# Patient Record
Sex: Male | Born: 1950
Health system: Southern US, Community
[De-identification: ages and names within clinical notes are randomized; demographics above are authoritative.]

## PROBLEM LIST (undated history)

## (undated) DIAGNOSIS — I2510 Atherosclerotic heart disease of native coronary artery without angina pectoris: Secondary | ICD-10-CM

## (undated) DIAGNOSIS — U071 COVID-19: Secondary | ICD-10-CM

## (undated) DIAGNOSIS — I10 Essential (primary) hypertension: Secondary | ICD-10-CM

## (undated) DIAGNOSIS — I214 Non-ST elevation (NSTEMI) myocardial infarction: Secondary | ICD-10-CM

## (undated) DIAGNOSIS — E785 Hyperlipidemia, unspecified: Secondary | ICD-10-CM

## (undated) DIAGNOSIS — E1165 Type 2 diabetes mellitus with hyperglycemia: Secondary | ICD-10-CM

## (undated) HISTORY — DX: Type 2 diabetes mellitus with hyperglycemia: E11.65

## (undated) HISTORY — PX: FRACTURE SURGERY: SHX138

## (undated) HISTORY — DX: COVID-19: U07.1

---
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
## 1998-09-23 ENCOUNTER — Emergency Department (HOSPITAL_COMMUNITY): Admission: EM | Admit: 1998-09-23 | Discharge: 1998-09-23 | Payer: Self-pay | Admitting: Emergency Medicine

## 1998-09-24 ENCOUNTER — Encounter: Payer: Self-pay | Admitting: Emergency Medicine

## 2000-06-01 ENCOUNTER — Encounter: Payer: Self-pay | Admitting: Specialist

## 2000-06-01 ENCOUNTER — Ambulatory Visit (HOSPITAL_COMMUNITY): Admission: RE | Admit: 2000-06-01 | Discharge: 2000-06-01 | Payer: Self-pay | Admitting: Specialist

## 2000-06-03 ENCOUNTER — Ambulatory Visit (HOSPITAL_COMMUNITY): Admission: EM | Admit: 2000-06-03 | Discharge: 2000-06-03 | Payer: Self-pay | Admitting: Emergency Medicine

## 2000-06-03 ENCOUNTER — Encounter: Payer: Self-pay | Admitting: Specialist

## 2012-05-15 ENCOUNTER — Other Ambulatory Visit: Payer: Self-pay | Admitting: Orthopedic Surgery

## 2012-05-15 ENCOUNTER — Encounter (HOSPITAL_BASED_OUTPATIENT_CLINIC_OR_DEPARTMENT_OTHER): Payer: Self-pay | Admitting: *Deleted

## 2012-05-15 NOTE — Progress Notes (Signed)
No heart or resp problems-[facility]

## 2012-05-16 ENCOUNTER — Ambulatory Visit (HOSPITAL_BASED_OUTPATIENT_CLINIC_OR_DEPARTMENT_OTHER)
Admission: RE | Admit: 2012-05-16 | Discharge: 2012-05-16 | Disposition: A | Discharge status: Home / Self Care | Payer: BC Managed Care – PPO | Source: Ambulatory Visit | Attending: Orthopedic Surgery | Admitting: Orthopedic Surgery

## 2012-05-16 ENCOUNTER — Encounter (HOSPITAL_BASED_OUTPATIENT_CLINIC_OR_DEPARTMENT_OTHER): Payer: Self-pay | Admitting: Anesthesiology

## 2012-05-16 ENCOUNTER — Encounter (HOSPITAL_BASED_OUTPATIENT_CLINIC_OR_DEPARTMENT_OTHER)
Admission: RE | Disposition: A | Discharge status: Home / Self Care | Payer: Self-pay | Source: Ambulatory Visit | Attending: Orthopedic Surgery

## 2012-05-16 ENCOUNTER — Ambulatory Visit (HOSPITAL_BASED_OUTPATIENT_CLINIC_OR_DEPARTMENT_OTHER): Payer: BC Managed Care – PPO | Admitting: Anesthesiology

## 2012-05-16 DIAGNOSIS — W268XXA Contact with other sharp object(s), not elsewhere classified, initial encounter: Secondary | ICD-10-CM | POA: Insufficient documentation

## 2012-05-16 DIAGNOSIS — S60459A Superficial foreign body of unspecified finger, initial encounter: Secondary | ICD-10-CM | POA: Insufficient documentation

## 2012-05-16 HISTORY — PX: FOREIGN BODY REMOVAL: SHX962

## 2012-05-16 LAB — POCT HEMOGLOBIN-HEMACUE: Hemoglobin: 13.9 g/dL (ref 13.0–17.0)

## 2012-05-16 SURGERY — FOREIGN BODY REMOVAL ADULT
Anesthesia: General | Site: Thumb | Laterality: Left | Wound class: Clean Contaminated

## 2012-05-16 MED ORDER — LIDOCAINE HCL (CARDIAC) 20 MG/ML IV SOLN
INTRAVENOUS | Status: DC | PRN
  Administered 2012-05-16: 100 mg via INTRAVENOUS

## 2012-05-16 MED ORDER — FENTANYL CITRATE 0.05 MG/ML IJ SOLN
INTRAMUSCULAR | Status: DC | PRN
  Administered 2012-05-16 (×2): 100 ug via INTRAVENOUS

## 2012-05-16 MED ORDER — HYDROCODONE-ACETAMINOPHEN 5-325 MG PO TABS: ORAL_TABLET | ORAL | Status: DC

## 2012-05-16 MED ORDER — CEFAZOLIN SODIUM-DEXTROSE 2-3 GM-% IV SOLR
2.0000 g | INTRAVENOUS | Status: AC
  Administered 2012-05-16: 2 g via INTRAVENOUS

## 2012-05-16 MED ORDER — CHLORHEXIDINE GLUCONATE 4 % EX LIQD: 60.0000 mL | Freq: Once | CUTANEOUS | Status: DC

## 2012-05-16 MED ORDER — BUPIVACAINE HCL (PF) 0.25 % IJ SOLN
INTRAMUSCULAR | Status: DC | PRN
  Administered 2012-05-16: 5 mL

## 2012-05-16 MED ORDER — PROPOFOL 10 MG/ML IV BOLUS
INTRAVENOUS | Status: DC | PRN
  Administered 2012-05-16: 250 mg via INTRAVENOUS

## 2012-05-16 MED ORDER — ONDANSETRON HCL 4 MG/2ML IJ SOLN
INTRAMUSCULAR | Status: DC | PRN
  Administered 2012-05-16: 4 mg via INTRAVENOUS

## 2012-05-16 MED ORDER — DEXAMETHASONE SODIUM PHOSPHATE 4 MG/ML IJ SOLN
INTRAMUSCULAR | Status: DC | PRN
  Administered 2012-05-16: 10 mg via INTRAVENOUS

## 2012-05-16 MED ORDER — MIDAZOLAM HCL 5 MG/5ML IJ SOLN
INTRAMUSCULAR | Status: DC | PRN
  Administered 2012-05-16: 2 mg via INTRAVENOUS

## 2012-05-16 MED ORDER — LACTATED RINGERS IV SOLN
INTRAVENOUS | Status: DC
  Administered 2012-05-16 (×2): via INTRAVENOUS

## 2012-05-16 SURGICAL SUPPLY — 43 items
BANDAGE COBAN STERILE 2 (GAUZE/BANDAGES/DRESSINGS) ×2 IMPLANT
BANDAGE ELASTIC 3 VELCRO ST LF (GAUZE/BANDAGES/DRESSINGS) IMPLANT
BANDAGE GAUZE ELAST BULKY 4 IN (GAUZE/BANDAGES/DRESSINGS) IMPLANT
BLADE MINI RND TIP GREEN BEAV (BLADE) IMPLANT
BLADE SURG 15 STRL LF DISP TIS (BLADE) ×2 IMPLANT
BLADE SURG 15 STRL SS (BLADE) ×2
BNDG COHESIVE 1X5 TAN STRL LF (GAUZE/BANDAGES/DRESSINGS) IMPLANT
BNDG ELASTIC 2 VLCR STRL LF (GAUZE/BANDAGES/DRESSINGS) IMPLANT
BNDG ESMARK 4X9 LF (GAUZE/BANDAGES/DRESSINGS) ×2 IMPLANT
CHLORAPREP W/TINT 26ML (MISCELLANEOUS) ×2 IMPLANT
CLOTH BEACON ORANGE TIMEOUT ST (SAFETY) ×2 IMPLANT
CORDS BIPOLAR (ELECTRODE) ×2 IMPLANT
COVER MAYO STAND STRL (DRAPES) ×2 IMPLANT
COVER TABLE BACK 60X90 (DRAPES) ×2 IMPLANT
CUFF TOURNIQUET SINGLE 18IN (TOURNIQUET CUFF) ×2 IMPLANT
DRAPE EXTREMITY T 121X128X90 (DRAPE) ×2 IMPLANT
DRAPE SURG 17X23 STRL (DRAPES) ×2 IMPLANT
GAUZE XEROFORM 1X8 LF (GAUZE/BANDAGES/DRESSINGS) ×2 IMPLANT
GLOVE BIO SURGEON STRL SZ 6.5 (GLOVE) ×2 IMPLANT
GLOVE BIO SURGEON STRL SZ7.5 (GLOVE) ×2 IMPLANT
GLOVE BIOGEL PI IND STRL 7.0 (GLOVE) ×2 IMPLANT
GLOVE BIOGEL PI INDICATOR 7.0 (GLOVE) ×2
GOWN BRE IMP PREV XXLGXLNG (GOWN DISPOSABLE) ×2 IMPLANT
GOWN PREVENTION PLUS XLARGE (GOWN DISPOSABLE) ×2 IMPLANT
NDL SAFETY ECLIPSE 18X1.5 (NEEDLE) IMPLANT
NEEDLE HYPO 18GX1.5 SHARP (NEEDLE)
NEEDLE HYPO 25X1 1.5 SAFETY (NEEDLE) ×2 IMPLANT
NS IRRIG 1000ML POUR BTL (IV SOLUTION) ×2 IMPLANT
PACK BASIN DAY SURGERY FS (CUSTOM PROCEDURE TRAY) ×2 IMPLANT
PAD CAST 3X4 CTTN HI CHSV (CAST SUPPLIES) IMPLANT
PADDING CAST ABS 4INX4YD NS (CAST SUPPLIES) ×1
PADDING CAST ABS COTTON 4X4 ST (CAST SUPPLIES) ×1 IMPLANT
PADDING CAST COTTON 3X4 STRL (CAST SUPPLIES)
SPONGE GAUZE 4X4 12PLY (GAUZE/BANDAGES/DRESSINGS) ×2 IMPLANT
STOCKINETTE 4X48 STRL (DRAPES) ×2 IMPLANT
SUT ETHILON 3 0 PS 1 (SUTURE) IMPLANT
SUT ETHILON 4 0 PS 2 18 (SUTURE) ×2 IMPLANT
SWAB COLLECTION DEVICE MRSA (MISCELLANEOUS) IMPLANT
SYR BULB 3OZ (MISCELLANEOUS) ×2 IMPLANT
SYR CONTROL 10ML LL (SYRINGE) ×2 IMPLANT
TOWEL OR 17X24 6PK STRL BLUE (TOWEL DISPOSABLE) ×2 IMPLANT
TUBE ANAEROBIC SPECIMEN COL (MISCELLANEOUS) IMPLANT
UNDERPAD 30X30 INCONTINENT (UNDERPADS AND DIAPERS) ×2 IMPLANT

## 2012-05-16 NOTE — Transfer of Care (Signed)
Immediate Anesthesia Transfer of Care Note  Patient: Aaron Summers  Procedure(s) Performed: Procedure(s): FOREIGN BODY REMOVAL LEFT THUMB (Left)  Patient Location: PACU  Anesthesia Type:General  Level of Consciousness: awake and alert   Airway & Oxygen Therapy: Patient Spontanous Breathing and Patient connected to face mask oxygen  Post-op Assessment: Report given to PACU RN and Post -op Vital signs reviewed and stable  Post vital signs: Reviewed and stable  Complications: No apparent anesthesia complications

## 2012-05-16 NOTE — Brief Op Note (Signed)
05/16/2012  10:33 AM  PATIENT:  Theodis Blaze  62 y.o. male  PRE-OPERATIVE DIAGNOSIS:  Foreign Body Left Thumb  POST-OPERATIVE DIAGNOSIS:  foreign body left thumb  PROCEDURE:  Procedure(s): FOREIGN BODY REMOVAL LEFT THUMB (Left)  SURGEON:  Surgeon(s) and Role:    * Tami Ribas, MD - Primary  PHYSICIAN ASSISTANT:   ASSISTANTS: none   ANESTHESIA:   general  EBL:  Total I/O In: 1000 [I.V.:1000] Out: -   BLOOD ADMINISTERED:none  DRAINS: none   LOCAL MEDICATIONS USED:  MARCAINE     SPECIMEN:  Source of Specimen:  left thumb  DISPOSITION OF SPECIMEN:  Patient  COUNTS:  YES  TOURNIQUET:   Total Tourniquet Time Documented: Upper Arm (Left) - 11 minutes Total: Upper Arm (Left) - 11 minutes   DICTATION: .Other Dictation: Dictation Number [id number]  PLAN OF CARE: Discharge to home after PACU  PATIENT DISPOSITION:  PACU - hemodynamically stable.

## 2012-05-16 NOTE — Anesthesia Procedure Notes (Signed)
Procedure Name: LMA Insertion Date/Time: 05/16/2012 10:10 AM Performed by: Caren Macadam Pre-anesthesia Checklist: Patient identified, Emergency Drugs available, Suction available and Patient being monitored Patient Re-evaluated:Patient Re-evaluated prior to inductionOxygen Delivery Method: Circle System Utilized Preoxygenation: Pre-oxygenation with 100% oxygen Intubation Type: IV induction Ventilation: Mask ventilation without difficulty LMA: LMA inserted LMA Size: 5.0 Number of attempts: 1 Airway Equipment and Method: bite block Placement Confirmation: positive ETCO2 and breath sounds checked- equal and bilateral Tube secured with: Tape Dental Injury: Teeth and Oropharynx as per pre-operative assessment

## 2012-05-16 NOTE — Anesthesia Preprocedure Evaluation (Signed)
Anesthesia Evaluation  Patient identified by MRN, date of birth, ID band Patient awake    Reviewed: Allergy & Precautions, H&P , NPO status , Patient's Chart, lab work & pertinent test results  Airway Mallampati: II  Neck ROM: full    Dental   Pulmonary          Cardiovascular     Neuro/Psych    GI/Hepatic   Endo/Other    Renal/GU      Musculoskeletal   Abdominal   Peds  Hematology   Anesthesia Other Findings   Reproductive/Obstetrics                           Anesthesia Physical Anesthesia Plan  ASA: I  Anesthesia Plan: General   Post-op Pain Management:    Induction: Intravenous  Airway Management Planned: LMA  Additional Equipment:   Intra-op Plan:   Post-operative Plan:   Informed Consent: I have reviewed the patients History and Physical, chart, labs and discussed the procedure including the risks, benefits and alternatives for the proposed anesthesia with the patient or authorized representative who has indicated his/her understanding and acceptance.     Plan Discussed with: CRNA, Anesthesiologist and Surgeon  Anesthesia Plan Comments:         Anesthesia Quick Evaluation

## 2012-05-16 NOTE — Op Note (Signed)
[id number] 

## 2012-05-16 NOTE — H&P (Signed)
  Aaron Summers is an 62 y.o. male.   Chief Complaint: foreign body left thumb HPI: 62 yo rhd male states that 3 days ago got splinter in left thumb while moving lumber in garage.  Was able to remove some himself, but feels there is a piece remaining in left thumb.  No fevers, chills, night sweats.  Past Medical History  Diagnosis Date  . Medical history non-contributory     Past Surgical History  Procedure Laterality Date  . Hernia repair      lt ing as infant  . Fracture surgery  1970s    fx rt clavical    History reviewed. No pertinent family history. Social History:  reports that he has never smoked. He does not have any smokeless tobacco history on file. He reports that he does not drink alcohol or use illicit drugs.  Allergies: No Known Allergies  Medications Prior to Admission  Medication Sig Dispense Refill  . sulfamethoxazole-trimethoprim (BACTRIM DS) 800-160 MG per tablet Take 1 tablet by mouth 2 (two) times daily.        No results found for this or any previous visit (from the past 48 hour(s)).  No results found.   A comprehensive review of systems was negative.  Blood pressure 193/111, pulse 72, temperature 97.7 F (36.5 C), temperature source Oral, resp. rate 20, height 6\' 4"  (1.93 m), weight 112.946 kg (249 lb), SpO2 96.00%.  General appearance: alert, cooperative and appears stated age Head: Normocephalic, without obvious abnormality, atraumatic Neck: supple, symmetrical, trachea midline Resp: clear to auscultation bilaterally Cardio: regular rate and rhythm GI: non tender Extremities: intact sensation and capillary refill all digits.  +epl/fpl/io.  ttp volar thumb over wound.  no other ttp.  5 mm wound with surrounding irritation.  no streaks or drainage. Pulses: 2+ and symmetric Skin: as above Neurologic: Grossly normal Incision/Wound: As above  Assessment/Plan Left thumb retained foreign body.  Non operative and operative treatment options  were discussed with the patient and patient wishes to proceed with operative treatment. Risks, benefits, and alternatives of surgery were discussed and the patient agrees with the plan of care.   Titilayo Hagans R 05/16/2012, 8:32 AM

## 2012-05-16 NOTE — Anesthesia Postprocedure Evaluation (Signed)
Anesthesia Post Note  Patient: Aaron Summers  Procedure(s) Performed: Procedure(s) (LRB): FOREIGN BODY REMOVAL LEFT THUMB (Left)  Anesthesia type: General  Patient location: PACU  Post pain: Pain level controlled and Adequate analgesia  Post assessment: Post-op Vital signs reviewed, Patient's Cardiovascular Status Stable, Respiratory Function Stable, Patent Airway and Pain level controlled  Last Vitals:  Filed Vitals:   05/16/12 1115  BP: 162/88  Pulse: 65  Temp:   Resp: 17    Post vital signs: Reviewed and stable  Level of consciousness: awake, alert  and oriented  Complications: No apparent anesthesia complications

## 2012-05-17 ENCOUNTER — Encounter (HOSPITAL_BASED_OUTPATIENT_CLINIC_OR_DEPARTMENT_OTHER): Payer: Self-pay | Admitting: Orthopedic Surgery

## 2012-05-17 NOTE — Op Note (Signed)
NAME:  Aaron Summers, Aaron Summers NO.:  [id number]  MEDICAL RECORD NO.:  [id number]  LOCATION:                                 FACILITY:  PHYSICIAN:  Betha Loa, MD             DATE OF BIRTH:  DATE OF PROCEDURE:  05/16/2012 DATE OF DISCHARGE:                              OPERATIVE REPORT   PREOPERATIVE DIAGNOSIS:  Left thumb foreign body.  POSTOPERATIVE DIAGNOSIS:  Left thumb foreign body.  PROCEDURE:  Excision of foreign body, left thumb.  SURGEON:  Betha Loa, MD  ASSISTANT:  None.  ANESTHESIA:  General.  IV FLUIDS:  Per anesthesia flow sheet.  ESTIMATED BLOOD LOSS:  Minimal.  COMPLICATIONS:  None.  SPECIMENS:  Wood splinter from left thumb given to the patient.  TOURNIQUET:  11 minutes.  DISPOSITION:  Stable to PACU.  INDICATIONS:  Aaron Summers is a 62 year old male who states he was moving lumber in his garage 3 days ago when he had got a splinter in his left thumb.  He got most of it out, but feels there is still some remaining. He saw me in the office.  We discussed the nature of the condition.  He wished to have it removed.  Risks, benefits and alternatives of the surgery were discussed including the risk of blood loss; infection; damage to nerves, vessels, tendons, ligaments, bone; failure of surgery; need for additional surgery; complications with wound healing; continued pain; retained foreign body.  He voiced understanding of these risks and elected to proceed.  OPERATIVE COURSE:  After being identified preoperatively by myself, the patient and I agreed upon the procedure and site of procedure.  Surgical site was marked.  The risks, benefits, and alternatives of surgery were reviewed and he wished to proceed.  Surgical consent had been signed. He was given IV Ancef as preoperative antibiotic prophylaxis.  He was transported to the operating room and placed on the operating room table in supine position with left upper extremity on an armboard.   General anesthesia was induced by anesthesiologist.  Left upper extremity was prepped and draped in normal sterile orthopedic fashion.  Surgical pause was performed between the surgeons, anesthesia, and operating room staff, and all were in agreement as to the patient, procedure, and site of procedure.  Tourniquet at the proximal aspect of the extremity was inflated to 250 mmHg after exsanguination of the limb with an Esmarch bandage.  The wound was extended both radially and ulnarly at the volar aspect of the thumb.  The wood splinter was easily identified in the subcutaneous tissues.  It was removed in its entirety.  The radial digital nerve was identified and was intact.  The FPL tendon was visualized and was intact.  The wound was explored and no remaining foreign body was noted.  The wound was copiously irrigated with sterile saline.  It was closed with 4-0 nylon in a horizontal mattress fashion. The traumatic portion of the wound was left open for any drainage as necessary.  The wound was injected with 5 mL of 0.25% plain Marcaine. It was then dressed with sterile Xeroform, 4x4s, and wrapped with a Coban dressing lightly.  Tourniquet was deflated at 11 minutes. Fingertips were pink with brisk capillary refill after deflation of the tourniquet.  Operative drapes were broken down.  The patient was awoken from anesthesia safely.  He was transferred back to the stretcher and taken to PACU in stable condition.  I will see him back in the office in 1 week for postprocedure followup.  I will give him Norco 5/325, 1-2 p.o. q.6 hours p.r.n. pain, dispensed, #30.     Betha Loa, MD     KK/MEDQ  D:  05/16/2012  T:  05/17/2012  Job:  [id number]

## 2016-12-01 ENCOUNTER — Other Ambulatory Visit: Payer: Self-pay | Admitting: Internal Medicine

## 2016-12-01 DIAGNOSIS — R9431 Abnormal electrocardiogram [ECG] [EKG]: Secondary | ICD-10-CM

## 2016-12-06 ENCOUNTER — Other Ambulatory Visit: Payer: Self-pay

## 2016-12-06 ENCOUNTER — Ambulatory Visit (HOSPITAL_COMMUNITY): Payer: BLUE CROSS/BLUE SHIELD | Attending: Cardiology

## 2016-12-06 ENCOUNTER — Encounter (INDEPENDENT_AMBULATORY_CARE_PROVIDER_SITE_OTHER): Payer: Self-pay

## 2016-12-06 DIAGNOSIS — R9431 Abnormal electrocardiogram [ECG] [EKG]: Secondary | ICD-10-CM | POA: Diagnosis not present

## 2016-12-06 DIAGNOSIS — I517 Cardiomegaly: Secondary | ICD-10-CM | POA: Diagnosis not present

## 2017-12-10 ENCOUNTER — Other Ambulatory Visit: Payer: Self-pay

## 2017-12-10 ENCOUNTER — Emergency Department (HOSPITAL_COMMUNITY): Payer: BLUE CROSS/BLUE SHIELD

## 2017-12-10 ENCOUNTER — Encounter (HOSPITAL_COMMUNITY): Payer: Self-pay

## 2017-12-10 ENCOUNTER — Inpatient Hospital Stay (HOSPITAL_COMMUNITY)
Admission: EM | Admit: 2017-12-10 | Discharge: 2017-12-13 | DRG: 282 | Disposition: A | Discharge status: Home / Self Care | Payer: BLUE CROSS/BLUE SHIELD | Attending: Internal Medicine | Admitting: Internal Medicine

## 2017-12-10 DIAGNOSIS — I25118 Atherosclerotic heart disease of native coronary artery with other forms of angina pectoris: Secondary | ICD-10-CM | POA: Diagnosis not present

## 2017-12-10 DIAGNOSIS — Z7982 Long term (current) use of aspirin: Secondary | ICD-10-CM | POA: Diagnosis not present

## 2017-12-10 DIAGNOSIS — Z6832 Body mass index (BMI) 32.0-32.9, adult: Secondary | ICD-10-CM | POA: Diagnosis not present

## 2017-12-10 DIAGNOSIS — G4733 Obstructive sleep apnea (adult) (pediatric): Secondary | ICD-10-CM | POA: Diagnosis present

## 2017-12-10 DIAGNOSIS — I209 Angina pectoris, unspecified: Secondary | ICD-10-CM

## 2017-12-10 DIAGNOSIS — I10 Essential (primary) hypertension: Secondary | ICD-10-CM | POA: Diagnosis present

## 2017-12-10 DIAGNOSIS — E785 Hyperlipidemia, unspecified: Secondary | ICD-10-CM | POA: Diagnosis not present

## 2017-12-10 DIAGNOSIS — I214 Non-ST elevation (NSTEMI) myocardial infarction: Secondary | ICD-10-CM

## 2017-12-10 DIAGNOSIS — Z79899 Other long term (current) drug therapy: Secondary | ICD-10-CM | POA: Diagnosis not present

## 2017-12-10 DIAGNOSIS — I2510 Atherosclerotic heart disease of native coronary artery without angina pectoris: Secondary | ICD-10-CM | POA: Diagnosis present

## 2017-12-10 DIAGNOSIS — E782 Mixed hyperlipidemia: Secondary | ICD-10-CM

## 2017-12-10 DIAGNOSIS — E669 Obesity, unspecified: Secondary | ICD-10-CM | POA: Diagnosis present

## 2017-12-10 HISTORY — DX: Angina pectoris, unspecified: I20.9

## 2017-12-10 HISTORY — DX: Essential (primary) hypertension: I10

## 2017-12-10 HISTORY — DX: Hyperlipidemia, unspecified: E78.5

## 2017-12-10 HISTORY — DX: Atherosclerotic heart disease of native coronary artery without angina pectoris: I25.10

## 2017-12-10 HISTORY — DX: Non-ST elevation (NSTEMI) myocardial infarction: I21.4

## 2017-12-10 HISTORY — DX: Mixed hyperlipidemia: E78.2

## 2017-12-10 LAB — CBC
HCT: 48.5 % (ref 39.0–52.0)
Hemoglobin: 16.3 g/dL (ref 13.0–17.0)
MCH: 29.5 pg (ref 26.0–34.0)
MCHC: 33.6 g/dL (ref 30.0–36.0)
MCV: 87.7 fL (ref 80.0–100.0)
Platelets: 239 10*3/uL (ref 150–400)
RBC: 5.53 MIL/uL (ref 4.22–5.81)
RDW: 12.4 % (ref 11.5–15.5)
WBC: 9.2 10*3/uL (ref 4.0–10.5)
nRBC: 0 % (ref 0.0–0.2)

## 2017-12-10 LAB — BASIC METABOLIC PANEL
Anion gap: 10 (ref 5–15)
BUN: 13 mg/dL (ref 8–23)
CO2: 23 mmol/L (ref 22–32)
Calcium: 9.5 mg/dL (ref 8.9–10.3)
Chloride: 104 mmol/L (ref 98–111)
Creatinine, Ser: 0.8 mg/dL (ref 0.61–1.24)
Glucose, Bld: 160 mg/dL — ABNORMAL HIGH (ref 70–99)
Potassium: 3.8 mmol/L (ref 3.5–5.1)
Sodium: 137 mmol/L (ref 135–145)

## 2017-12-10 LAB — I-STAT TROPONIN, ED: Troponin i, poc: 2.4 ng/mL (ref 0.00–0.08)

## 2017-12-10 MED ORDER — ONDANSETRON HCL 4 MG/2ML IJ SOLN
4.0000 mg | Freq: Once | INTRAMUSCULAR | Status: AC
  Administered 2017-12-10: 4 mg via INTRAVENOUS
  Filled 2017-12-10: qty 2

## 2017-12-10 MED ORDER — NITROGLYCERIN 0.4 MG SL SUBL
0.4000 mg | SUBLINGUAL_TABLET | SUBLINGUAL | Status: DC | PRN
  Administered 2017-12-11 (×2): 0.4 mg via SUBLINGUAL
  Filled 2017-12-10: qty 1

## 2017-12-10 MED ORDER — SODIUM CHLORIDE 0.9 % IV SOLN
INTRAVENOUS | Status: DC
  Administered 2017-12-10: 21:00:00 via INTRAVENOUS

## 2017-12-10 MED ORDER — IOPAMIDOL (ISOVUE-370) INJECTION 76%
INTRAVENOUS | Status: AC
  Filled 2017-12-10: qty 100

## 2017-12-10 MED ORDER — METOPROLOL TARTRATE 25 MG PO TABS
25.0000 mg | ORAL_TABLET | Freq: Two times a day (BID) | ORAL | Status: DC
  Administered 2017-12-10 – 2017-12-12 (×5): 25 mg via ORAL
  Filled 2017-12-10 (×5): qty 1

## 2017-12-10 MED ORDER — ONDANSETRON HCL 4 MG/2ML IJ SOLN: 4.0000 mg | Freq: Four times a day (QID) | INTRAMUSCULAR | Status: DC | PRN

## 2017-12-10 MED ORDER — NITROGLYCERIN 0.4 MG SL SUBL
0.4000 mg | SUBLINGUAL_TABLET | SUBLINGUAL | Status: DC | PRN
  Administered 2017-12-10 (×3): 0.4 mg via SUBLINGUAL
  Filled 2017-12-10: qty 1

## 2017-12-10 MED ORDER — MORPHINE SULFATE (PF) 2 MG/ML IV SOLN
2.0000 mg | Freq: Once | INTRAVENOUS | Status: AC
  Administered 2017-12-10: 2 mg via INTRAVENOUS
  Filled 2017-12-10: qty 1

## 2017-12-10 MED ORDER — IRBESARTAN 300 MG PO TABS
300.0000 mg | ORAL_TABLET | Freq: Every day | ORAL | Status: DC
  Administered 2017-12-11 – 2017-12-13 (×3): 300 mg via ORAL
  Filled 2017-12-10 (×5): qty 1

## 2017-12-10 MED ORDER — ACETAMINOPHEN 325 MG PO TABS: 650.0000 mg | ORAL_TABLET | ORAL | Status: DC | PRN

## 2017-12-10 MED ORDER — IOPAMIDOL (ISOVUE-370) INJECTION 76%
100.0000 mL | Freq: Once | INTRAVENOUS | Status: AC | PRN
  Administered 2017-12-10: 100 mL via INTRAVENOUS

## 2017-12-10 MED ORDER — HEPARIN (PORCINE) 25000 UT/250ML-% IV SOLN
1300.0000 [IU]/h | INTRAVENOUS | Status: DC
  Administered 2017-12-10: 1300 [IU]/h via INTRAVENOUS
  Filled 2017-12-10: qty 250

## 2017-12-10 MED ORDER — ATORVASTATIN CALCIUM 80 MG PO TABS
80.0000 mg | ORAL_TABLET | Freq: Every day | ORAL | Status: DC
  Administered 2017-12-10 – 2017-12-12 (×3): 80 mg via ORAL
  Filled 2017-12-10 (×3): qty 1

## 2017-12-10 MED ORDER — ASPIRIN 81 MG PO CHEW
162.0000 mg | CHEWABLE_TABLET | Freq: Once | ORAL | Status: AC
  Administered 2017-12-10: 162 mg via ORAL
  Filled 2017-12-10: qty 2

## 2017-12-10 MED ORDER — ASPIRIN EC 81 MG PO TBEC
81.0000 mg | DELAYED_RELEASE_TABLET | Freq: Every day | ORAL | Status: DC
  Administered 2017-12-11 – 2017-12-13 (×3): 81 mg via ORAL
  Filled 2017-12-10 (×3): qty 1

## 2017-12-10 MED ORDER — HEPARIN SODIUM (PORCINE) 5000 UNIT/ML IJ SOLN
4000.0000 [IU] | Freq: Once | INTRAMUSCULAR | Status: AC
  Administered 2017-12-10: 4000 [IU] via INTRAVENOUS

## 2017-12-10 NOTE — ED Notes (Signed)
Cards at bedside

## 2017-12-10 NOTE — H&P (Addendum)
Cardiology History & Physical    Patient ID: Aaron Summers MRN: [id number], DOB: 1950/01/18 Date of Encounter: 12/10/2017, 11:09 PM Primary Physician: Kirby Funk, MD Primary Cardiologist: No primary care provider on file. Primary Electrophysiologist:  None  Chief Complaint: Chest pain Reason for Admission: NSTEMI Requesting MD: Adella Nissen  HPI: Aaron Summers is a 67 y.o. male with history of hypertension, hyperlipidemia, who presents with NSTEMI. The patient recently visited [place] last month and dead heavy hiking for multiple weeks without limitations. He began having dull chest discomfort (low grade) on Saturday evening. Yesterday he developed intense discomfort in the front of his chest and bilateral arms from 4:15 pm to 11:15 pm. The pain eventually eased off and he went to sleep. He awoke this morning without ongoing chest discomfort; went to work, did a full day of work and then at about 4:15 pm today developed recurrent symptoms. Accompanying symptoms have included nausea. No syncope or pre-syncope.  He had a stress test many years ago that was normal (pharmacologic and exercise both have been done previously). Chest pain is currently resolved.   Past Medical History:  Diagnosis Date  . Coronary artery disease   . Hypertension   . Medical history non-contributory      Surgical History:  Past Surgical History:  Procedure Laterality Date  . FOREIGN BODY REMOVAL Left 05/16/2012   Procedure: FOREIGN BODY REMOVAL LEFT THUMB;  Surgeon: Tami Ribas, MD;  Location: [facility];  Service: Orthopedics;  Laterality: Left;  . FRACTURE SURGERY  1970s   fx rt clavical  . HERNIA REPAIR     lt ing as infant     Home Meds: Prior to Admission medications   Medication Sig Start Date End Date Taking? Authorizing Provider  amLODipine-valsartan (EXFORGE) 10-320 MG tablet Take 1 tablet by mouth daily.   Yes [provider]  aspirin EC 81 MG tablet Take 81 mg  by mouth daily.   Yes [provider]  pravastatin (PRAVACHOL) 40 MG tablet Take 40 mg by mouth daily.   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 He does not smoke cigarettes and has never smoked.  Family history is positive for heart disease mother had abdominal aortic aneurysm, father had heart surgery in his late 28s but was a smoker.   Review of Systems: General: negative for chills, fever, night sweats or weight changes.  Cardiovascular: positive for chest pain arm pain nausea Dermatological: negative for rash Respiratory: negative for cough or wheezing, positive for dyspnea  Urologic: negative for hematuria Abdominal: negative for nausea, vomiting, diarrhea, bright red blood per rectum, melena, or hematemesis Neurologic: negative for visual changes, syncope, or dizziness All other systems reviewed and are otherwise negative except as noted above.  Labs:   Lab Results  Component Value Date   WBC 9.2 12/10/2017   HGB 16.3 12/10/2017   HCT 48.5 12/10/2017   MCV 87.7 12/10/2017   PLT 239 12/10/2017    Recent Labs  Lab 12/10/17 1802  NA 137  K 3.8  CL 104  CO2 23  BUN 13  CREATININE 0.80  CALCIUM 9.5  GLUCOSE 160*   No results for input(s): CKTOTAL, CKMB, TROPONINI in the last 72 hours. No results found for: CHOL, HDL,  LDLCALC, TRIG No results found for: DDIMER  Radiology/Studies:  Dg Chest 2 View  Result Date: 12/10/2017 CLINICAL DATA:  Chest pain. Coronary artery disease and hypertension. EXAM: CHEST - 2 VIEW COMPARISON:  None. FINDINGS: Mild hyperinflation. Patient rotated left on the frontal. Midline trachea. Normal heart size. Tortuous thoracic aorta. Atherosclerosis in the transverse aorta. No pleural effusion or  pneumothorax. Clear lungs. IMPRESSION: No acute cardiopulmonary disease. Aortic Atherosclerosis (ICD10-I70.0). Electronically Signed   By: Jeronimo GreavesKyle  Talbot M.D.   On: 12/10/2017 18:53   Ct Angio Chest Pe W/cm &/or Wo Cm  Result Date: 12/10/2017 CLINICAL DATA:  67 year old male with dull chest pain x2 days. Recent return from international travel ([place] EXAM: CT ANGIOGRAPHY CHEST WITH CONTRAST TECHNIQUE: Multidetector CT imaging of the chest was performed using the standard protocol during bolus administration of intravenous contrast. Multiplanar CT image reconstructions and MIPs were obtained to evaluate the vascular anatomy. CONTRAST:  63 milliliters ISOVUE-370 IOPAMIDOL (ISOVUE-370) INJECTION 76% COMPARISON:  Chest radiographs earlier today. FINDINGS: Cardiovascular: Good contrast bolus timing in the pulmonary arterial tree. Mild lower lobe respiratory motion. No focal filling defect identified in the pulmonary arteries to suggest acute pulmonary embolism. Calcified coronary artery atherosclerosis (series 7, image 192). Little contrast in the aorta on this exam. Calcified aortic atherosclerosis. No cardiomegaly or pericardial effusion. Mediastinum/Nodes: Negative. No lymphadenopathy. Lungs/Pleura: Major airways are patent aside from some atelectatic changes about the hila. Mild dependent ground-glass opacity in both lungs most resembles atelectasis. No pleural effusion. No other abnormal pulmonary opacity. Upper Abdomen: Simple fluid density round 4.5 cm area in the central liver most suggestive of a benign a patent cysts. Otherwise negative visible liver, spleen, adrenal glands, and bowel in the upper abdomen. Musculoskeletal: Mild postoperative changes to the distal right clavicle. Mild Dextroconvex thoracic scoliosis. No acute osseous abnormality identified. Review of the MIP images confirms the above findings. IMPRESSION: 1. Negative for acute pulmonary embolus. 2. Calcified coronary artery and Aortic  Atherosclerosis (ICD10-I70.0). 3. Mild pulmonary atelectasis. 4. Benign hepatic cyst suspected in the central liver. Electronically Signed   By: Odessa FlemingH  Hall M.D.   On: 12/10/2017 20:54   Wt Readings from Last 3 Encounters:  12/10/17 114.8 kg  05/16/12 112.9 kg    EKG: normal sinus rhythm without ischemic ST/T changes   Physical Exam: Blood pressure (!) 141/85, pulse 81, temperature 97.7 F (36.5 C), temperature source Oral, resp. rate 20, height 6\' 2"  (1.88 m), weight 114.8 kg, SpO2 93 %. Body mass index is 32.48 kg/m. General: Well developed, well nourished, diaphoretic Head: Normocephalic, atraumatic, sclera non-icteric, no xanthomas, nares are without discharge.  Neck: Negative for carotid bruits. JVD not elevated. Lungs: Clear bilaterally to auscultation without wheezes, rales, or rhonchi. Breathing is unlabored. Heart: RRR with S1 S2. No murmurs, rubs, or gallops appreciated. Abdomen: Soft, non-tender, non-distended with normoactive bowel sounds. No hepatomegaly. No rebound/guarding. No obvious abdominal masses. Msk:  Strength and tone appear normal for age. Extremities: No clubbing or cyanosis. No edema.  Distal pedal pulses are 2+ and equal bilaterally. Neuro: Alert and oriented X 3. No focal deficit. No facial asymmetry. Moves all extremities spontaneously. Psych:  Responds to questions appropriately with a normal affect.    Assessment and Plan   Aaron Summers is a 67 y.o. male with history of hypertension, hyperlipidemia, who presents with NSTEMI.   1. Non-ST segment myocardial infarction. Somewhat delayed presentation but typical symptoms, and elevated troponin. Recent travel but PE ruled out with CT angiogram. CAD risk factors  include HTN, HL, and FHx. Currently chest pain free. 2. Essential hypertension 3. Hyperlipidemia  - Aspirin 81 mg po daily - Hold P2Y12 pending LHC - Heparin per pharmacy - Atorvastatin 80 mg po qhs - Metoprolol 25 mg po BID - Irbesartan 300 mg  po daily (valsartan nonformulary) - Hold home HCTZ - NTGL PRN - TTE - HgbA1c, TSH, lipid panel - NPO for LHC - Repeat 12-lead EKG for any change in symptom status    Severity of Illness: The appropriate patient status for this patient is INPATIENT. Inpatient status is judged to be reasonable and necessary in order to provide the required intensity of service to ensure the patient's safety. The patient's presenting symptoms, physical exam findings, and initial radiographic and laboratory data in the context of their chronic comorbidities is felt to place them at high risk for further clinical deterioration. Furthermore, it is not anticipated that the patient will be medically stable for discharge from the hospital within 2 midnights of admission. The following factors support the patient status of inpatient.   " The patient's presenting symptoms include chest pain. " The worrisome physical exam findings include diaphoresis. " The initial radiographic and laboratory data are worrisome because of abnormal troponin. " The chronic co-morbidities include hypertension and hyperlipidemia.   * I certify that at the point of admission it is my clinical judgment that the patient will require inpatient hospital care spanning beyond 2 midnights from the point of admission due to high intensity of service, high risk for further deterioration and high frequency of surveillance required.*    For questions or updates, please contact [facility] Please consult [email or web address] for contact info under Cardiology/STEMI.  Signed, Laverda Page, MD 12/10/2017, 11:09 PM

## 2017-12-10 NOTE — ED Provider Notes (Signed)
[facility] EMERGENCY DEPARTMENT Provider Note   CSN: [id number] Arrival date & time: 12/10/17  1743     History   Chief Complaint Chief Complaint  Patient presents with  . Chest Pain    HPI Aaron Summers is a 67 y.o. male.  Patient just recently flew back from [place].  Patient also was doing a lot of hiking  had no chest pain.  Yesterday patient had sudden onset of chest pain sort of substernal area started at 4 in the afternoon and 10:59 in the evening.  Then slowly dissipated.  Felt okay this morning.  Pain reoccurred at 4:00's afternoon.  Is remained constant currently 6 out of 10.  Pain radiates to the back.  Pain is worse with taking a deep breath.  No history of coronary disease.  Does have a history of hypertension.  No nausea vomiting no diaphoresis.     Past Medical History:  Diagnosis Date  . Coronary artery disease   . Hypertension   . Medical history non-contributory     There are no active problems to display for this patient.   Past Surgical History:  Procedure Laterality Date  . FOREIGN BODY REMOVAL Left 05/16/2012   Procedure: FOREIGN BODY REMOVAL LEFT THUMB;  Surgeon: Tami Ribas, MD;  Location: [facility];  Service: Orthopedics;  Laterality: Left;  . FRACTURE SURGERY  1970s   fx rt clavical  . HERNIA REPAIR     lt ing as infant        Home Medications    Prior to Admission medications   Medication Sig Start Date End Date Taking? Authorizing Provider  amLODipine-valsartan (EXFORGE) 10-320 MG tablet Take 1 tablet by mouth daily.   Yes [provider]  aspirin EC 81 MG tablet Take 81 mg by mouth daily.   Yes [provider]  pravastatin (PRAVACHOL) 40 MG tablet Take 40 mg by mouth daily.   Yes [provider]  HYDROcodone-acetaminophen (NORCO) 5-325 MG per tablet 1-2 tabs po q6 hours prn pain Patient not taking: Reported on 12/10/2017 05/16/12   Betha Loa, MD    Family  History No family history on file.  Social History Social History   Tobacco Use  . Smoking status: Never Smoker  Substance Use Topics  . Alcohol use: No  . Drug use: No     Allergies   Patient has no known allergies.   Review of Systems Review of Systems  Constitutional: Negative for fever.  HENT: Negative for congestion.   Eyes: Negative for visual disturbance.  Respiratory: Negative for shortness of breath.   Cardiovascular: Positive for chest pain. Negative for leg swelling.  Gastrointestinal: Negative for abdominal pain.  Genitourinary: Negative for dysuria.  Musculoskeletal: Positive for back pain.  Skin: Negative for rash.  Neurological: Negative for syncope.  Hematological: Does not bruise/bleed easily.  Psychiatric/Behavioral: Negative for confusion.     Physical Exam Updated Vital Signs BP (!) 158/81   Pulse 79   Temp 97.7 F (36.5 C) (Oral)   Resp 18   Ht 1.88 m (6\' 2" )   Wt 114.8 kg   SpO2 98%   BMI 32.48 kg/m   Physical Exam  Constitutional: He is oriented to person, place, and time. He appears well-developed and well-nourished. No distress.  HENT:  Head: Normocephalic and atraumatic.  Mouth/Throat: Oropharynx is clear and moist.  Eyes: Pupils are equal, round, and reactive to light. Conjunctivae and EOM are normal.  Neck: Neck  supple.  Cardiovascular: Normal rate, regular rhythm and normal heart sounds.  Pulmonary/Chest: Effort normal and breath sounds normal. No respiratory distress.  Abdominal: Soft. Bowel sounds are normal. There is no tenderness.  Musculoskeletal: Normal range of motion. He exhibits no edema.  Patient has several Band-Aids on his toes from where his nails were injured from the extensive hiking.  No signs of any secondary infection.  Neurological: He is oriented to person, place, and time. No cranial nerve deficit or sensory deficit. He exhibits normal muscle tone. Coordination normal.  Skin: Skin is warm.  Nursing note  and vitals reviewed.    ED Treatments / Results  Labs (all labs ordered are listed, but only abnormal results are displayed) Labs Reviewed  BASIC METABOLIC PANEL - Abnormal; Notable for the following components:      Result Value   Glucose, Bld 160 (*)    All other components within normal limits  I-STAT TROPONIN, ED - Abnormal; Notable for the following components:   Troponin i, poc 2.40 (*)    All other components within normal limits  CBC    EKG EKG Interpretation  Date/Time:  Monday December 10 2017 17:50:37 EST Ventricular Rate:  97 PR Interval:  172 QRS Duration: 108 QT Interval:  350 QTC Calculation: 444 R Axis:   -43 Text Interpretation:  Normal sinus rhythm Left axis deviation Left ventricular hypertrophy with repolarization abnormality similar tostudy from 2002 Abnormal ekg Confirmed by Gerhard MunchLockwood, Robert [phone]([id number]) on 12/10/2017 5:54:17 PM   Radiology Dg Chest 2 View  Result Date: 12/10/2017 CLINICAL DATA:  Chest pain. Coronary artery disease and hypertension. EXAM: CHEST - 2 VIEW COMPARISON:  None. FINDINGS: Mild hyperinflation. Patient rotated left on the frontal. Midline trachea. Normal heart size. Tortuous thoracic aorta. Atherosclerosis in the transverse aorta. No pleural effusion or pneumothorax. Clear lungs. IMPRESSION: No acute cardiopulmonary disease. Aortic Atherosclerosis (ICD10-I70.0). Electronically Signed   By: Jeronimo GreavesKyle  Talbot M.D.   On: 12/10/2017 18:53   Ct Angio Chest Pe W/cm &/or Wo Cm  Result Date: 12/10/2017 CLINICAL DATA:  67 year old male with dull chest pain x2 days. Recent return from international travel ([place] EXAM: CT ANGIOGRAPHY CHEST WITH CONTRAST TECHNIQUE: Multidetector CT imaging of the chest was performed using the standard protocol during bolus administration of intravenous contrast. Multiplanar CT image reconstructions and MIPs were obtained to evaluate the vascular anatomy. CONTRAST:  63 milliliters ISOVUE-370 IOPAMIDOL (ISOVUE-370)  INJECTION 76% COMPARISON:  Chest radiographs earlier today. FINDINGS: Cardiovascular: Good contrast bolus timing in the pulmonary arterial tree. Mild lower lobe respiratory motion. No focal filling defect identified in the pulmonary arteries to suggest acute pulmonary embolism. Calcified coronary artery atherosclerosis (series 7, image 192). Little contrast in the aorta on this exam. Calcified aortic atherosclerosis. No cardiomegaly or pericardial effusion. Mediastinum/Nodes: Negative. No lymphadenopathy. Lungs/Pleura: Major airways are patent aside from some atelectatic changes about the hila. Mild dependent ground-glass opacity in both lungs most resembles atelectasis. No pleural effusion. No other abnormal pulmonary opacity. Upper Abdomen: Simple fluid density round 4.5 cm area in the central liver most suggestive of a benign a patent cysts. Otherwise negative visible liver, spleen, adrenal glands, and bowel in the upper abdomen. Musculoskeletal: Mild postoperative changes to the distal right clavicle. Mild Dextroconvex thoracic scoliosis. No acute osseous abnormality identified. Review of the MIP images confirms the above findings. IMPRESSION: 1. Negative for acute pulmonary embolus. 2. Calcified coronary artery and Aortic Atherosclerosis (ICD10-I70.0). 3. Mild pulmonary atelectasis. 4. Benign hepatic cyst suspected in the central liver. Electronically  Signed   By: Odessa Fleming M.D.   On: 12/10/2017 20:54    Procedures Procedures (including critical care time)  CRITICAL CARE Performed by: Vanetta Mulders Total critical care time: 30 minutes Critical care time was exclusive of separately billable procedures and treating other patients. Critical care was necessary to treat or prevent imminent or life-threatening deterioration. Critical care was time spent personally by me on the following activities: development of treatment plan with patient and/or surrogate as well as nursing, discussions with consultants,  evaluation of patient's response to treatment, examination of patient, obtaining history from patient or surrogate, ordering and performing treatments and interventions, ordering and review of laboratory studies, ordering and review of radiographic studies, pulse oximetry and re-evaluation of patient's condition.   Medications Ordered in ED Medications  nitroGLYCERIN (NITROSTAT) SL tablet 0.4 mg (0.4 mg Sublingual Given 12/10/17 2033)  0.9 %  sodium chloride infusion ( Intravenous New Bag/Given 12/10/17 2041)  heparin injection 4,000 Units (has no administration in time range)  heparin ADULT infusion 100 units/mL (25000 units/223mL sodium chloride 0.45%) (has no administration in time range)  aspirin chewable tablet 162 mg (162 mg Oral Given 12/10/17 1954)  ondansetron (ZOFRAN) injection 4 mg (4 mg Intravenous Given 12/10/17 2041)  morphine 2 MG/ML injection 2 mg (2 mg Intravenous Given 12/10/17 2042)  iopamidol (ISOVUE-370) 76 % injection 100 mL (100 mLs Intravenous Contrast Given 12/10/17 2016)     Initial Impression / Assessment and Plan / ED Course  I have reviewed the triage vital signs and the nursing notes.  Pertinent labs & imaging results that were available during my care of the patient were reviewed by me and considered in my medical decision making (see chart for details).    Patient's work-up initial troponin was significantly elevated at 2.  Patient underwent CT angios to rule out pulmonary embolism due to the long flight and also to rule out any kind of dissection.  That was negative.  Based on this patient symptoms consistent with a non-STEMI.  Patient started on heparin.  Prior to this patient was given aspirin and nitroglycerin and morphine and the pain went from a 6 out of 10 down to below 1.  Discussed with cardiology they will see.   Final Clinical Impressions(s) / ED Diagnoses   Final diagnoses:  NSTEMI (non-ST elevated myocardial infarction) [facility])    ED Discharge  Orders    None       Vanetta Mulders, MD 12/10/17 2130

## 2017-12-10 NOTE — ED Triage Notes (Signed)
Pt reports central dull CP that started 2 days ago, pt states he just got back from [place] where he hiked a total of 100 miles. Pt is a [employer]. No hx of blood clots. States pain is better when he takes in a deep breath. Hx of CAD and HTN. Pt in NAD at this time

## 2017-12-10 NOTE — Progress Notes (Signed)
ANTICOAGULATION CONSULT NOTE - Initial Consult  Pharmacy Consult for heparin Indication: chest pain/ACS  Patient Measurements: Height: 6\' 2"  (188 cm) Weight: 253 lb (114.8 kg) IBW/kg (Calculated) : 82.2 Heparin Dosing Weight: 106 kg  Vital Signs: Temp: 97.7 F (36.5 C) (12/09 1752) Temp Source: Oral (12/09 1752) BP: 158/81 (12/09 2030) Pulse Rate: 79 (12/09 2030)  Labs: Recent Labs    12/10/17 1802  HGB 16.3  HCT 48.5  PLT 239  CREATININE 0.80    Assessment: 67 yo male admitted with chest pain. Has a history of CAD and HTN and is only on a couple medications prior to admission. Will start a heparin gtt for rule out ACS. Trop +  Goal of Therapy:  Heparin level 0.3-0.7 units/ml Monitor platelets by anticoagulation protocol: Yes    Plan:  -Heparin 4000 units x1 then 1300 units/hr -Daily HL, CBC -Check level with AM labs   Baldemar FridayMasters, Kam Kushnir M 12/10/2017,9:02 PM

## 2017-12-10 NOTE — ED Notes (Signed)
Pt to CT via stretcher

## 2017-12-11 ENCOUNTER — Encounter (HOSPITAL_COMMUNITY)
Admission: EM | Disposition: A | Discharge status: Home / Self Care | Payer: Self-pay | Source: Home / Self Care | Attending: Internal Medicine

## 2017-12-11 ENCOUNTER — Inpatient Hospital Stay (HOSPITAL_COMMUNITY): Payer: BLUE CROSS/BLUE SHIELD

## 2017-12-11 ENCOUNTER — Encounter (HOSPITAL_COMMUNITY): Payer: Self-pay | Admitting: Cardiology

## 2017-12-11 ENCOUNTER — Other Ambulatory Visit (HOSPITAL_COMMUNITY): Payer: BLUE CROSS/BLUE SHIELD

## 2017-12-11 DIAGNOSIS — I214 Non-ST elevation (NSTEMI) myocardial infarction: Principal | ICD-10-CM

## 2017-12-11 DIAGNOSIS — E782 Mixed hyperlipidemia: Secondary | ICD-10-CM

## 2017-12-11 DIAGNOSIS — E8881 Metabolic syndrome: Secondary | ICD-10-CM

## 2017-12-11 DIAGNOSIS — I10 Essential (primary) hypertension: Secondary | ICD-10-CM

## 2017-12-11 DIAGNOSIS — I2510 Atherosclerotic heart disease of native coronary artery without angina pectoris: Secondary | ICD-10-CM

## 2017-12-11 HISTORY — PX: LEFT HEART CATH AND CORONARY ANGIOGRAPHY: CATH118249

## 2017-12-11 LAB — BASIC METABOLIC PANEL
Anion gap: 10 (ref 5–15)
BUN: 9 mg/dL (ref 8–23)
CO2: 23 mmol/L (ref 22–32)
Calcium: 9.1 mg/dL (ref 8.9–10.3)
Chloride: 105 mmol/L (ref 98–111)
Creatinine, Ser: 0.84 mg/dL (ref 0.61–1.24)
GFR calc Af Amer: >60 mL/min (ref >60)
GFR calc non Af Amer: >60 mL/min (ref >60)
Glucose, Bld: 156 mg/dL — ABNORMAL HIGH (ref 70–99)
Potassium: 4.3 mmol/L (ref 3.5–5.1)
Sodium: 138 mmol/L (ref 135–145)

## 2017-12-11 LAB — CBC
HCT: 45.3 % (ref 39.0–52.0)
HCT: 45.7 % (ref 39.0–52.0)
Hemoglobin: 15.4 g/dL (ref 13.0–17.0)
Hemoglobin: 15.4 g/dL (ref 13.0–17.0)
MCH: 29.7 pg (ref 26.0–34.0)
MCH: 29.3 pg (ref 26.0–34.0)
MCHC: 34 g/dL (ref 30.0–36.0)
MCHC: 33.7 g/dL (ref 30.0–36.0)
MCV: 87.3 fL (ref 80.0–100.0)
MCV: 87 fL (ref 80.0–100.0)
Platelets: 213 10*3/uL (ref 150–400)
Platelets: 209 10*3/uL (ref 150–400)
RBC: 5.19 MIL/uL (ref 4.22–5.81)
RBC: 5.25 MIL/uL (ref 4.22–5.81)
RDW: 12.8 % (ref 11.5–15.5)
RDW: 12.6 % (ref 11.5–15.5)
WBC: 7.8 10*3/uL (ref 4.0–10.5)
WBC: 7.7 10*3/uL (ref 4.0–10.5)
nRBC: 0 % (ref 0.0–0.2)
nRBC: 0 % (ref 0.0–0.2)

## 2017-12-11 LAB — PROTIME-INR
INR: 1.17
Prothrombin Time: 14.8 seconds (ref 11.4–15.2)

## 2017-12-11 LAB — LIPID PANEL
Cholesterol: 195 mg/dL (ref 0–200)
HDL: 29 mg/dL — ABNORMAL LOW (ref >40)
LDL Cholesterol: 144 mg/dL — ABNORMAL HIGH (ref 0–99)
Total CHOL/HDL Ratio: 6.7 RATIO
Triglycerides: 111 mg/dL (ref <150)
VLDL: 22 mg/dL (ref 0–40)

## 2017-12-11 LAB — ECHOCARDIOGRAM COMPLETE
Height: 74 in
Weight: 4048 oz

## 2017-12-11 LAB — HEMOGLOBIN A1C
Hgb A1c MFr Bld: 5.7 % — ABNORMAL HIGH (ref 4.8–5.6)
Mean Plasma Glucose: 116.89 mg/dL

## 2017-12-11 LAB — HIV ANTIBODY (ROUTINE TESTING W REFLEX): HIV Screen 4th Generation wRfx: NONREACTIVE

## 2017-12-11 LAB — HEPARIN LEVEL (UNFRACTIONATED): Heparin Unfractionated: 0.32 IU/mL (ref 0.30–0.70)

## 2017-12-11 LAB — TSH: TSH: 3.989 u[IU]/mL (ref 0.350–4.500)

## 2017-12-11 LAB — CREATININE, SERUM
Creatinine, Ser: 0.77 mg/dL (ref 0.61–1.24)
GFR calc Af Amer: >60 mL/min (ref >60)
GFR calc non Af Amer: >60 mL/min (ref >60)

## 2017-12-11 SURGERY — LEFT HEART CATH AND CORONARY ANGIOGRAPHY
Anesthesia: LOCAL

## 2017-12-11 MED ORDER — ISOSORBIDE MONONITRATE ER 30 MG PO TB24
30.0000 mg | ORAL_TABLET | Freq: Every day | ORAL | Status: DC
  Administered 2017-12-11 – 2017-12-13 (×3): 30 mg via ORAL
  Filled 2017-12-11 (×3): qty 1

## 2017-12-11 MED ORDER — SODIUM CHLORIDE 0.9 % WEIGHT BASED INFUSION: 1.0000 mL/kg/h | INTRAVENOUS | Status: DC

## 2017-12-11 MED ORDER — MIDAZOLAM HCL 2 MG/2ML IJ SOLN
INTRAMUSCULAR | Status: DC | PRN
  Administered 2017-12-11: 1 mg via INTRAVENOUS

## 2017-12-11 MED ORDER — ANGIOPLASTY BOOK
Freq: Once | Status: AC
  Administered 2017-12-12: 07:00:00
  Filled 2017-12-11: qty 1

## 2017-12-11 MED ORDER — FENTANYL CITRATE (PF) 100 MCG/2ML IJ SOLN
INTRAMUSCULAR | Status: AC
  Filled 2017-12-11: qty 2

## 2017-12-11 MED ORDER — HEPARIN (PORCINE) IN NACL 1000-0.9 UT/500ML-% IV SOLN
INTRAVENOUS | Status: AC
  Filled 2017-12-11: qty 1000

## 2017-12-11 MED ORDER — SODIUM CHLORIDE 0.9 % WEIGHT BASED INFUSION
3.0000 mL/kg/h | INTRAVENOUS | Status: DC
  Administered 2017-12-11: 3 mL/kg/h via INTRAVENOUS

## 2017-12-11 MED ORDER — CLOPIDOGREL BISULFATE 75 MG PO TABS
ORAL_TABLET | ORAL | Status: AC
  Filled 2017-12-11: qty 1

## 2017-12-11 MED ORDER — ENOXAPARIN SODIUM 40 MG/0.4ML ~~LOC~~ SOLN
40.0000 mg | SUBCUTANEOUS | Status: DC
  Administered 2017-12-12 – 2017-12-13 (×2): 40 mg via SUBCUTANEOUS
  Filled 2017-12-11 (×2): qty 0.4

## 2017-12-11 MED ORDER — HEPARIN (PORCINE) IN NACL 1000-0.9 UT/500ML-% IV SOLN
INTRAVENOUS | Status: DC | PRN
  Administered 2017-12-11 (×2): 500 mL

## 2017-12-11 MED ORDER — SODIUM CHLORIDE 0.9% FLUSH: 3.0000 mL | Freq: Two times a day (BID) | INTRAVENOUS | Status: DC

## 2017-12-11 MED ORDER — SODIUM CHLORIDE 0.9 % IV SOLN: 250.0000 mL | INTRAVENOUS | Status: DC | PRN

## 2017-12-11 MED ORDER — SODIUM CHLORIDE 0.9% FLUSH: 3.0000 mL | INTRAVENOUS | Status: DC | PRN

## 2017-12-11 MED ORDER — FENTANYL CITRATE (PF) 100 MCG/2ML IJ SOLN
INTRAMUSCULAR | Status: DC | PRN
  Administered 2017-12-11: 25 ug via INTRAVENOUS

## 2017-12-11 MED ORDER — VERAPAMIL HCL 2.5 MG/ML IV SOLN
INTRAVENOUS | Status: AC
  Filled 2017-12-11: qty 2

## 2017-12-11 MED ORDER — HEPARIN SODIUM (PORCINE) 1000 UNIT/ML IJ SOLN
INTRAMUSCULAR | Status: DC | PRN
  Administered 2017-12-11: 6000 [IU] via INTRAVENOUS

## 2017-12-11 MED ORDER — IOHEXOL 350 MG/ML SOLN
INTRAVENOUS | Status: DC | PRN
  Administered 2017-12-11: 75 mL

## 2017-12-11 MED ORDER — HEART ATTACK BOUNCING BOOK
Freq: Once | Status: AC
  Administered 2017-12-12: 07:00:00
  Filled 2017-12-11: qty 1

## 2017-12-11 MED ORDER — CLOPIDOGREL BISULFATE 75 MG PO TABS
75.0000 mg | ORAL_TABLET | Freq: Every day | ORAL | Status: DC
  Administered 2017-12-11 – 2017-12-13 (×3): 75 mg via ORAL
  Filled 2017-12-11 (×2): qty 1

## 2017-12-11 MED ORDER — LIDOCAINE HCL (PF) 1 % IJ SOLN
INTRAMUSCULAR | Status: DC | PRN
  Administered 2017-12-11: 2 mL

## 2017-12-11 MED ORDER — HEPARIN SODIUM (PORCINE) 1000 UNIT/ML IJ SOLN
INTRAMUSCULAR | Status: AC
  Filled 2017-12-11: qty 1

## 2017-12-11 MED ORDER — SODIUM CHLORIDE 0.9 % WEIGHT BASED INFUSION: 1.0000 mL/kg/h | INTRAVENOUS | Status: AC

## 2017-12-11 MED ORDER — LIDOCAINE HCL (PF) 1 % IJ SOLN
INTRAMUSCULAR | Status: AC
  Filled 2017-12-11: qty 30

## 2017-12-11 MED ORDER — VERAPAMIL HCL 2.5 MG/ML IV SOLN
INTRAVENOUS | Status: DC | PRN
  Administered 2017-12-11: 10 mL via INTRA_ARTERIAL

## 2017-12-11 MED ORDER — MORPHINE SULFATE (PF) 2 MG/ML IV SOLN
2.0000 mg | Freq: Once | INTRAVENOUS | Status: AC
  Administered 2017-12-11: 2 mg via INTRAVENOUS
  Filled 2017-12-11: qty 1

## 2017-12-11 MED ORDER — NITROGLYCERIN IN D5W 200-5 MCG/ML-% IV SOLN
2.0000 ug/min | INTRAVENOUS | Status: DC
  Administered 2017-12-11: 4 ug/min via INTRAVENOUS
  Filled 2017-12-11: qty 250

## 2017-12-11 MED ORDER — MIDAZOLAM HCL 2 MG/2ML IJ SOLN
INTRAMUSCULAR | Status: AC
  Filled 2017-12-11: qty 2

## 2017-12-11 MED ORDER — SODIUM CHLORIDE 0.9% FLUSH
3.0000 mL | Freq: Two times a day (BID) | INTRAVENOUS | Status: DC
  Administered 2017-12-11 – 2017-12-12 (×3): 3 mL via INTRAVENOUS

## 2017-12-11 SURGICAL SUPPLY — 10 items
CATH 5FR JL3.5 JR4 ANG PIG MP (CATHETERS) ×2 IMPLANT
DEVICE RAD COMP TR BAND LRG (VASCULAR PRODUCTS) ×2 IMPLANT
GLIDESHEATH SLEND SS 6F .021 (SHEATH) ×2 IMPLANT
GUIDEWIRE INQWIRE 1.5J.035X260 (WIRE) ×1 IMPLANT
INQWIRE 1.5J .035X260CM (WIRE) ×2
KIT HEART LEFT (KITS) ×2 IMPLANT
PACK CARDIAC CATHETERIZATION (CUSTOM PROCEDURE TRAY) ×2 IMPLANT
SYR MEDRAD MARK 7 150ML (SYRINGE) ×2 IMPLANT
TRANSDUCER W/STOPCOCK (MISCELLANEOUS) ×2 IMPLANT
TUBING CIL FLEX 10 FLL-RA (TUBING) ×2 IMPLANT

## 2017-12-11 NOTE — ED Notes (Signed)
Pt resting on stretcher on bed with eyes closed, RR even and unlabored. NAD.

## 2017-12-11 NOTE — Progress Notes (Signed)
*  PRELIMINARY RESULTS* Echocardiogram 2D Echocardiogram has been performed.  Stacey DrainWhite, Kseniya Grunden J 12/11/2017, 2:46 PM

## 2017-12-11 NOTE — ED Notes (Signed)
Pt states pain down slightly after first nitro.  Repeat dose given

## 2017-12-11 NOTE — Progress Notes (Signed)
Progress Note  Patient Name: Aaron Summers Date of Encounter: 12/11/2017  Primary Cardiologist: Wayne BothH.  W.  B.  , III, MD  Subjective   Recurring chest tightness waxing and waning over the past 24 to 36 hours.  Inpatient Medications    Scheduled Meds: . aspirin EC  81 mg Oral Daily  . atorvastatin  80 mg Oral q1800  . irbesartan  300 mg Oral Daily  . metoprolol tartrate  25 mg Oral BID   Continuous Infusions: . heparin 1,300 Units/hr (12/10/17 2128)  . nitroGLYCERIN 4 mcg/min (12/11/17 0539)   PRN Meds: acetaminophen, nitroGLYCERIN, ondansetron (ZOFRAN) IV   Vital Signs    Vitals:   12/11/17 0317 12/11/17 0400 12/11/17 0500 12/11/17 0600  BP: (!) 138/93 118/75 126/78 121/77  Pulse: 74 71 61 69  Resp: 18 20 14 15   Temp:      TempSrc:      SpO2: 96% 93% 96% 97%  Weight:      Height:        Intake/Output Summary (Last 24 hours) at 12/11/2017 0624 Last data filed at 12/10/2017 2216 Gross per 24 hour  Intake -  Output 300 ml  Net -300 ml   Filed Weights   12/10/17 1754  Weight: 114.8 kg    Telemetry    Normal sinus rhythm- Personally Reviewed  ECG    Tall R wave in V2.  There is nonspecific ST-T abnormality.  QS pattern V1 and V2.- Personally Reviewed  Physical Exam  Obese GEN: No acute distress.   Neck: No JVD Cardiac: RRR, no murmurs, rubs, or gallops.  Respiratory: Clear to auscultation bilaterally. GI: Soft, nontender, non-distended  MS: No edema; No deformity. Neuro:  Nonfocal  Psych: Normal affect   Labs    Chemistry Recent Labs  Lab 12/10/17 1802 12/11/17 0358  NA 137 138  K 3.8 4.3  CL 104 105  CO2 23 23  GLUCOSE 160* 156*  BUN 13 9  CREATININE 0.80 0.84  CALCIUM 9.5 9.1  GFRNONAA NOT CALCULATED >60  GFRAA NOT CALCULATED >60  ANIONGAP 10 10     Hematology Recent Labs  Lab 12/10/17 1802 12/11/17 0358  WBC 9.2 7.8  RBC 5.53 5.19  HGB 16.3 15.4  HCT 48.5 45.3  MCV 87.7 87.3  MCH 29.5 29.7  MCHC 33.6 34.0    RDW 12.4 12.8  PLT 239 213    Cardiac EnzymesNo results for input(s): TROPONINI in the last 168 hours.  Recent Labs  Lab 12/10/17 1825  TROPIPOC 2.40*     BNPNo results for input(s): BNP, PROBNP in the last 168 hours.   DDimer No results for input(s): DDIMER in the last 168 hours.   Radiology    Dg Chest 2 View  Result Date: 12/10/2017 CLINICAL DATA:  Chest pain. Coronary artery disease and hypertension. EXAM: CHEST - 2 VIEW COMPARISON:  None. FINDINGS: Mild hyperinflation. Patient rotated left on the frontal. Midline trachea. Normal heart size. Tortuous thoracic aorta. Atherosclerosis in the transverse aorta. No pleural effusion or pneumothorax. Clear lungs. IMPRESSION: No acute cardiopulmonary disease. Aortic Atherosclerosis (ICD10-I70.0). Electronically Signed   By: Jeronimo GreavesKyle  Talbot M.D.   On: 12/10/2017 18:53   Ct Angio Chest Pe W/cm &/or Wo Cm  Result Date: 12/10/2017 CLINICAL DATA:  67 year old male with dull chest pain x2 days. Recent return from international travel ([place]). EXAM: CT ANGIOGRAPHY CHEST WITH CONTRAST TECHNIQUE: Multidetector CT imaging of the chest was performed using the standard protocol during bolus administration  of intravenous contrast. Multiplanar CT image reconstructions and MIPs were obtained to evaluate the vascular anatomy. CONTRAST:  63 milliliters ISOVUE-370 IOPAMIDOL (ISOVUE-370) INJECTION 76% COMPARISON:  Chest radiographs earlier today. FINDINGS: Cardiovascular: Good contrast bolus timing in the pulmonary arterial tree. Mild lower lobe respiratory motion. No focal filling defect identified in the pulmonary arteries to suggest acute pulmonary embolism. Calcified coronary artery atherosclerosis (series 7, image 192). Little contrast in the aorta on this exam. Calcified aortic atherosclerosis. No cardiomegaly or pericardial effusion. Mediastinum/Nodes: Negative. No lymphadenopathy. Lungs/Pleura: Major airways are patent aside from some atelectatic changes  about the hila. Mild dependent ground-glass opacity in both lungs most resembles atelectasis. No pleural effusion. No other abnormal pulmonary opacity. Upper Abdomen: Simple fluid density round 4.5 cm area in the central liver most suggestive of a benign a patent cysts. Otherwise negative visible liver, spleen, adrenal glands, and bowel in the upper abdomen. Musculoskeletal: Mild postoperative changes to the distal right clavicle. Mild Dextroconvex thoracic scoliosis. No acute osseous abnormality identified. Review of the MIP images confirms the above findings. IMPRESSION: 1. Negative for acute pulmonary embolus. 2. Calcified coronary artery and Aortic Atherosclerosis (ICD10-I70.0). 3. Mild pulmonary atelectasis. 4. Benign hepatic cyst suspected in the central liver. Electronically Signed   By: Odessa Fleming M.D.   On: 12/10/2017 20:54    Cardiac Studies   12/10/2017 pulmonary CT angiogram demonstrated no evidence of pulmonary emboli but did reveal coronary artery calcification and aortic atherosclerosis.  Patient Profile     67 y.o. male with essential hypertension, hyperlipidemia, atherosclerotic disease (mother abdominal aortic aneurysm and father CAD) who presents with waxing and waning chest discomfort over 36 hours and an elevated troponin consistent with non-ST elevation MI 12/10/2017  Assessment & Plan    1. Non-ST elevation acute coronary syndrome (NSTEACS).-Needs early definition of coronary anatomy and revascularization based upon findings.  IV heparin.  Will withhold Plavix/Brilinta addition until the time of cath in case the patient has multivessel disease requiring surgery. 2. Essential hypertension with target blood pressure 130/80 mmHg.  Beta-blocker therapy is started. 3. Hyperlipidemia with LDL target less than 70 and preferably less than 50 mg/dL.  High intensity statin therapy is started. 4. Metabolic syndrome with hemoglobin A1c 5.7.  We will see what the triglyceride is on the lipid  panel which is been ordered.  The patient was counseled to undergo left heart catheterization, coronary angiography, and possible percutaneous coronary intervention with stent implantation. The procedural risks and benefits were discussed in detail. The risks discussed included death, stroke, myocardial infarction, life-threatening bleeding, limb ischemia, kidney injury, allergy, and possible emergency cardiac surgery. The risk of these significant complications were estimated to occur less than 1% of the time. After discussion, the patient has agreed to proceed.  For questions or updates, please contact [facility] Please consult [email or web address] for contact info under Cardiology/STEMI.      Signed, Lesleigh Noe, MD  12/11/2017, 6:24 AM

## 2017-12-11 NOTE — Progress Notes (Signed)
ANTICOAGULATION CONSULT NOTE   Pharmacy Consult for Heparin Indication: chest pain/ACS  Patient Measurements: Height: 6\' 2"  (188 cm) Weight: 253 lb (114.8 kg) IBW/kg (Calculated) : 82.2 Heparin Dosing Weight: 106 kg  Vital Signs: BP: 124/83 (12/10 0700) Pulse Rate: 69 (12/10 0700)  Labs: Recent Labs    12/10/17 1802 12/11/17 0358 12/11/17 0500  HGB 16.3 15.4  --   HCT 48.5 45.3  --   PLT 239 213  --   LABPROT  --  14.8  --   INR  --  1.17  --   HEPARINUNFRC  --   --  0.32  CREATININE 0.80 0.84  --     Assessment: 67 yo male admitted with chest pain. Has a history of CAD and HTN and is only on a couple medications prior to admission. Will start a heparin gtt for rule out ACS. Trop +  12/10 AM update: initial heparin level is therapeutic   Goal of Therapy:  Heparin level 0.3-0.7 units/ml Monitor platelets by anticoagulation protocol: Yes    Plan:  -Cont heparin 1300 units/hr -1200 HL   [name] 12/11/2017,7:42 AM

## 2017-12-11 NOTE — H&P (View-Only) (Signed)
 Progress Note  Patient Name: Aaron Summers Date of Encounter: 12/11/2017  Primary Cardiologist: H.  W.  B.  Kayti Poss, III, MD  Subjective   Recurring chest tightness waxing and waning over the past 24 to 36 hours.  Inpatient Medications    Scheduled Meds: . aspirin EC  81 mg Oral Daily  . atorvastatin  80 mg Oral q1800  . irbesartan  300 mg Oral Daily  . metoprolol tartrate  25 mg Oral BID   Continuous Infusions: . heparin 1,300 Units/hr (12/10/17 2128)  . nitroGLYCERIN 4 mcg/min (12/11/17 0539)   PRN Meds: acetaminophen, nitroGLYCERIN, ondansetron (ZOFRAN) IV   Vital Signs    Vitals:   12/11/17 0317 12/11/17 0400 12/11/17 0500 12/11/17 0600  BP: (!) 138/93 118/75 126/78 121/77  Pulse: 74 71 61 69  Resp: 18 20 14 15  Temp:      TempSrc:      SpO2: 96% 93% 96% 97%  Weight:      Height:        Intake/Output Summary (Last 24 hours) at 12/11/2017 0624 Last data filed at 12/10/2017 2216 Gross per 24 hour  Intake -  Output 300 ml  Net -300 ml   Filed Weights   12/10/17 1754  Weight: 114.8 kg    Telemetry    Normal sinus rhythm- Personally Reviewed  ECG    Tall R wave in V2.  There is nonspecific ST-T abnormality.  QS pattern V1 and V2.- Personally Reviewed  Physical Exam  Obese GEN: No acute distress.   Neck: No JVD Cardiac: RRR, no murmurs, rubs, or gallops.  Respiratory: Clear to auscultation bilaterally. GI: Soft, nontender, non-distended  MS: No edema; No deformity. Neuro:  Nonfocal  Psych: Normal affect   Labs    Chemistry Recent Labs  Lab 12/10/17 1802 12/11/17 0358  NA 137 138  K 3.8 4.3  CL 104 105  CO2 23 23  GLUCOSE 160* 156*  BUN 13 9  CREATININE 0.80 0.84  CALCIUM 9.5 9.1  GFRNONAA NOT CALCULATED >60  GFRAA NOT CALCULATED >60  ANIONGAP 10 10     Hematology Recent Labs  Lab 12/10/17 1802 12/11/17 0358  WBC 9.2 7.8  RBC 5.53 5.19  HGB 16.3 15.4  HCT 48.5 45.3  MCV 87.7 87.3  MCH 29.5 29.7  MCHC 33.6 34.0    RDW 12.4 12.8  PLT 239 213    Cardiac EnzymesNo results for input(s): TROPONINI in the last 168 hours.  Recent Labs  Lab 12/10/17 1825  TROPIPOC 2.40*     BNPNo results for input(s): BNP, PROBNP in the last 168 hours.   DDimer No results for input(s): DDIMER in the last 168 hours.   Radiology    Dg Chest 2 View  Result Date: 12/10/2017 CLINICAL DATA:  Chest pain. Coronary artery disease and hypertension. EXAM: CHEST - 2 VIEW COMPARISON:  None. FINDINGS: Mild hyperinflation. Patient rotated left on the frontal. Midline trachea. Normal heart size. Tortuous thoracic aorta. Atherosclerosis in the transverse aorta. No pleural effusion or pneumothorax. Clear lungs. IMPRESSION: No acute cardiopulmonary disease. Aortic Atherosclerosis (ICD10-I70.0). Electronically Signed   By: Kyle  Talbot M.D.   On: 12/10/2017 18:53   Ct Angio Chest Pe W/cm &/or Wo Cm  Result Date: 12/10/2017 CLINICAL DATA:  67-year-old male with dull chest pain x2 days. Recent return from international travel ([place]). EXAM: CT ANGIOGRAPHY CHEST WITH CONTRAST TECHNIQUE: Multidetector CT imaging of the chest was performed using the standard protocol during bolus administration   of intravenous contrast. Multiplanar CT image reconstructions and MIPs were obtained to evaluate the vascular anatomy. CONTRAST:  63 milliliters ISOVUE-370 IOPAMIDOL (ISOVUE-370) INJECTION 76% COMPARISON:  Chest radiographs earlier today. FINDINGS: Cardiovascular: Good contrast bolus timing in the pulmonary arterial tree. Mild lower lobe respiratory motion. No focal filling defect identified in the pulmonary arteries to suggest acute pulmonary embolism. Calcified coronary artery atherosclerosis (series 7, image 192). Little contrast in the aorta on this exam. Calcified aortic atherosclerosis. No cardiomegaly or pericardial effusion. Mediastinum/Nodes: Negative. No lymphadenopathy. Lungs/Pleura: Major airways are patent aside from some atelectatic changes  about the hila. Mild dependent ground-glass opacity in both lungs most resembles atelectasis. No pleural effusion. No other abnormal pulmonary opacity. Upper Abdomen: Simple fluid density round 4.5 cm area in the central liver most suggestive of a benign a patent cysts. Otherwise negative visible liver, spleen, adrenal glands, and bowel in the upper abdomen. Musculoskeletal: Mild postoperative changes to the distal right clavicle. Mild Dextroconvex thoracic scoliosis. No acute osseous abnormality identified. Review of the MIP images confirms the above findings. IMPRESSION: 1. Negative for acute pulmonary embolus. 2. Calcified coronary artery and Aortic Atherosclerosis (ICD10-I70.0). 3. Mild pulmonary atelectasis. 4. Benign hepatic cyst suspected in the central liver. Electronically Signed   By: H  Hall M.D.   On: 12/10/2017 20:54    Cardiac Studies   12/10/2017 pulmonary CT angiogram demonstrated no evidence of pulmonary emboli but did reveal coronary artery calcification and aortic atherosclerosis.  Patient Profile     67 y.o. male with essential hypertension, hyperlipidemia, atherosclerotic disease (mother abdominal aortic aneurysm and father CAD) who presents with waxing and waning chest discomfort over 36 hours and an elevated troponin consistent with non-ST elevation MI 12/10/2017  Assessment & Plan    1. Non-ST elevation acute coronary syndrome (NSTEACS).-Needs early definition of coronary anatomy and revascularization based upon findings.  IV heparin.  Will withhold Plavix/Brilinta addition until the time of cath in case the patient has multivessel disease requiring surgery. 2. Essential hypertension with target blood pressure 130/80 mmHg.  Beta-blocker therapy is started. 3. Hyperlipidemia with LDL target less than 70 and preferably less than 50 mg/dL.  High intensity statin therapy is started. 4. Metabolic syndrome with hemoglobin A1c 5.7.  We will see what the triglyceride is on the lipid  panel which is been ordered.  The patient was counseled to undergo left heart catheterization, coronary angiography, and possible percutaneous coronary intervention with stent implantation. The procedural risks and benefits were discussed in detail. The risks discussed included death, stroke, myocardial infarction, life-threatening bleeding, limb ischemia, kidney injury, allergy, and possible emergency cardiac surgery. The risk of these significant complications were estimated to occur less than 1% of the time. After discussion, the patient has agreed to proceed.  For questions or updates, please contact [facility] Please consult [email or web address] for contact info under Cardiology/STEMI.      Signed, Lanah Steines W Camreigh Michie III, MD  12/11/2017, 6:24 AM    

## 2017-12-11 NOTE — ED Notes (Signed)
Pt now having more intense midsternal pain, continues to rate 5/10 but feels this is more painful than previous 5. Repeat EKG done, cards notified

## 2017-12-11 NOTE — Interval H&P Note (Signed)
History and Physical Interval Note:  12/11/2017 8:29 AM  Aaron BlazeWilliam E Breed  has presented today for surgery, with the diagnosis of cp  The various methods of treatment have been discussed with the patient and family. After consideration of risks, benefits and other options for treatment, the patient has consented to  Procedure(s): LEFT HEART CATH AND CORONARY ANGIOGRAPHY (N/A) as a surgical intervention .  The patient's history has been reviewed, patient examined, no change in status, stable for surgery.  I have reviewed the patient's chart and labs.  Questions were answered to the patient's satisfaction.   Cath Lab Visit (complete for each Cath Lab visit)  Clinical Evaluation Leading to the Procedure:   ACS: Yes.    Non-ACS:    Anginal Classification: CCS IV  Anti-ischemic medical therapy: Maximal Therapy (2 or more classes of medications)  Non-Invasive Test Results: No non-invasive testing performed  Prior CABG: No previous CABG        Theron Aristaeter [facility] LLCJordanMD,FACC 12/11/2017 8:29 AM

## 2017-12-12 ENCOUNTER — Encounter (HOSPITAL_COMMUNITY): Payer: Self-pay | Admitting: Student

## 2017-12-12 DIAGNOSIS — I2510 Atherosclerotic heart disease of native coronary artery without angina pectoris: Secondary | ICD-10-CM

## 2017-12-12 DIAGNOSIS — I25118 Atherosclerotic heart disease of native coronary artery with other forms of angina pectoris: Secondary | ICD-10-CM

## 2017-12-12 HISTORY — DX: Atherosclerotic heart disease of native coronary artery without angina pectoris: I25.10

## 2017-12-12 LAB — CBC
HCT: 41.4 % (ref 39.0–52.0)
Hemoglobin: 14.3 g/dL (ref 13.0–17.0)
MCH: 30.2 pg (ref 26.0–34.0)
MCHC: 34.5 g/dL (ref 30.0–36.0)
MCV: 87.3 fL (ref 80.0–100.0)
Platelets: 195 10*3/uL (ref 150–400)
RBC: 4.74 MIL/uL (ref 4.22–5.81)
RDW: 12.7 % (ref 11.5–15.5)
WBC: 10 10*3/uL (ref 4.0–10.5)
nRBC: 0 % (ref 0.0–0.2)

## 2017-12-12 MED ORDER — METOPROLOL TARTRATE 25 MG PO TABS: 25.0000 mg | ORAL_TABLET | Freq: Two times a day (BID) | ORAL | 2 refills | Status: DC

## 2017-12-12 MED ORDER — ATORVASTATIN CALCIUM 80 MG PO TABS: 80.0000 mg | ORAL_TABLET | Freq: Every day | ORAL | 2 refills | Status: DC

## 2017-12-12 MED ORDER — ISOSORBIDE MONONITRATE ER 30 MG PO TB24: 30.0000 mg | ORAL_TABLET | Freq: Every day | ORAL | 2 refills | Status: DC

## 2017-12-12 MED ORDER — CLOPIDOGREL BISULFATE 75 MG PO TABS: 75.0000 mg | ORAL_TABLET | Freq: Every day | ORAL | 11 refills | Status: DC

## 2017-12-12 MED ORDER — NITROGLYCERIN 0.4 MG SL SUBL: 0.4000 mg | SUBLINGUAL_TABLET | SUBLINGUAL | 12 refills | Status: DC | PRN

## 2017-12-12 NOTE — Discharge Summary (Addendum)
The patient has been seen in conjunction with Marjie Skiff, PAC. All aspects of care have been considered and discussed. The patient has been personally interviewed, examined, and all clinical data has been reviewed.   He is ready for discharge.  All teaching is been done.  Aggressive secondary prevention  Phase 2 cardiac rehab  TOC follow-up 1 to 2 weeks.   Discharge Summary    Patient ID: Aaron Summers MRN: [id number]; DOB: [birth year]  Admit date: 12/10/2017 Discharge date: 12/12/2017  Primary Care Provider: Kirby Funk, MD  Primary Cardiologist: Lesleigh Noe, MD (New Consult) Primary Electrophysiologist:  None   Discharge Diagnoses    Principal Problem:   NSTEMI (non-ST elevated myocardial infarction) [facility]) Active Problems:   HTN (hypertension)   Hyperlipemia   CAD (coronary artery disease)   Allergies No Known Allergies  Diagnostic Studies/Procedures    Left Heart Catheterization 12/11/2017:  Ost Cx to Prox Cx lesion is 100% stenosed.  Ost LAD to Prox LAD lesion is 40% stenosed.  Mid LAD lesion is 50% stenosed.  Ramus lesion is 70% stenosed.  The left ventricular systolic function is normal.  LV end diastolic pressure is normal.  The left ventricular ejection fraction is 55-65% by visual estimate.   1. Single vessel occlusive CAD. The LCx is occluded. This appears to be a relatively small vessel with a large ramus branch supplying the lateral wall.  2. Normal LV function 3. Normal LVEDP  Plan: aggressive medical therapy and risk factor modification. DAPT for one year for ACS.  _____________  Echocardiogram 12/11/2017: Study Conclusions: - Left ventricle: The cavity size was normal. There was mild   concentric hypertrophy. Systolic function was normal. The   estimated ejection fraction was in the range of 55% to 60%.   Probable mild hypokinesis of the basalinferolateral myocardium;   consistent with ischemia or infarction in the  distribution of the   left circumflex coronary artery. Doppler parameters are   consistent with abnormal left ventricular relaxation (grade 1   diastolic dysfunction). - Left atrium: The atrium was mildly dilated.   History of Present Illness     Mr. Aaron Summers is a 67 year old male with a history of hypertension and hyperlipidemia who presented to the [facility] ED on 12/10/2017 for evaluation of chest pain. Patient recently visited [place] last month and did heavy hiking for multiple weeks without any limitations. He reports onset of mild dull chest discomfort on the evening of 12/08/2017. On 12/09/2017, he developed intense discomfort in the front of his chest and bilateral arms from 4:15pm to 11:15pm. The pain eventually eased off and he was able to go to sleep. He woke up on the morning of 12/10/2017 without any chest discomfort. He went to work and then around 4:15pm he started to develop recurrent symptoms. Associated symptoms have included nausea. He denied any syncope or presyncope. He reportedly had a stress test many years ago which he states was normal.   Upon arrival to the ED, EKG showed no acute ischemic changes. I-stat troponin was elevated at 2.40. Chest x-ray showed aortic atherosclerosis but no acute findings. CBC was unremarkable. Na 137, K 3.8, Glucose 160, SCr 0.80. Patient was admitted for ACS workup.  Hospital Course     Consultants: None.  NSTEMI Mr. Ratterree was admitted on 12/10/2017 for NSTEMI as stated above. Patient underwent left heart catheterization on 12/11/2017 which showed single vessel occlusive CAD of the left circumflex. This appeared to be a  relatively small vessel with a large ramus branch supplying the lateral wall. Echocardiogram showed LVEF of 55-60% with probable mild hypokinesis of the basalinferolateral myocardium and grade 1 diastolic dysfunction. Patient tolerated procedure well. He is feeling well today with no chest pain. - Continue dual antiplatelet  therapy with Aspirin 81mg  daily and Plavix 75mg  daily for a minimum of 12 months given ACS. - Continue Lipitor 80mg , Lopressor 25mg  twice daily, and Imdur 30mg  daily.   Hypertension - Will restart home Amlodipine-Valsartan 10-320mg  daily at discharge. - Continue Lopressor and Imdur as above.   Hyperlipidemia LDL 144 this admission. - Continue Lipitor as above.  - Will likely need repeat lipid panel and CMP in about 6 weeks.   MD to see prior to discharge. Outpatient follow-up has been arranged. Medications as below.  _____________  Discharge Vitals Blood pressure 140/74, pulse 76, temperature 98.2 F (36.8 C), temperature source Oral, resp. rate 18, height 6\' 2"  (1.88 m), weight 115 kg, SpO2 93 %.  Filed Weights   12/10/17 1754 12/12/17 0552  Weight: 114.8 kg 115 kg   General: Well developed, well nourished, male in no acute distress. Head: Normocephalic and atraumatic. Eyes: Sclera clear. No xanthomas. Lungs: No increased work of breathing. Clear bilaterally to auscultation. No wheezes, rhonchi, or rales. Neck: Supple. Heart: RRR. Distinct S1 and S2. No murmurs, gallops, or rubs. Radial and distal pedal pulses 2+ and equal bilaterally. Right radial cath site soft with no signs of hematoma. Abdomen: Soft, non-distended, and non-tender. Bowel sounds are present. MSK: Normal strength and tone for age. Extremities: No lower extremity edema.    Skin:  Warm and dry. Neuro: Alert and oriented x3. Psych:  Good affect. Responds appropriately.  Labs & Radiologic Studies    CBC Recent Labs    12/11/17 1107 12/12/17 0349  WBC 7.7 10.0  HGB 15.4 14.3  HCT 45.7 41.4  MCV 87.0 87.3  PLT 209 195   Basic Metabolic Panel Recent Labs    40/98/11 1802 12/11/17 0358 12/11/17 1107  NA 137 138  --   K 3.8 4.3  --   CL 104 105  --   CO2 23 23  --   GLUCOSE 160* 156*  --   BUN 13 9  --   CREATININE 0.80 0.84 0.77  CALCIUM 9.5 9.1  --    Liver Function Tests No results for  input(s): AST, ALT, ALKPHOS, BILITOT, PROT, ALBUMIN in the last 72 hours. No results for input(s): LIPASE, AMYLASE in the last 72 hours. Cardiac Enzymes No results for input(s): CKTOTAL, CKMB, CKMBINDEX, TROPONINI in the last 72 hours. BNP Invalid input(s): POCBNP D-Dimer No results for input(s): DDIMER in the last 72 hours. Hemoglobin A1C Recent Labs    12/11/17 0358  HGBA1C 5.7*   Fasting Lipid Panel Recent Labs    12/11/17 0358  CHOL 195  HDL 29*  LDLCALC 144*  TRIG 111  CHOLHDL 6.7   Thyroid Function Tests Recent Labs    12/11/17 1107  TSH 3.989   _____________  Dg Chest 2 View  Result Date: 12/10/2017 CLINICAL DATA:  Chest pain. Coronary artery disease and hypertension. EXAM: CHEST - 2 VIEW COMPARISON:  None. FINDINGS: Mild hyperinflation. Patient rotated left on the frontal. Midline trachea. Normal heart size. Tortuous thoracic aorta. Atherosclerosis in the transverse aorta. No pleural effusion or pneumothorax. Clear lungs. IMPRESSION: No acute cardiopulmonary disease. Aortic Atherosclerosis (ICD10-I70.0). Electronically Signed   By: Jeronimo Greaves M.D.   On: 12/10/2017 18:53  Ct Angio Chest Pe W/cm &/or Wo Cm  Result Date: 12/10/2017 CLINICAL DATA:  67 year old male with dull chest pain x2 days. Recent return from international travel ([place]). EXAM: CT ANGIOGRAPHY CHEST WITH CONTRAST TECHNIQUE: Multidetector CT imaging of the chest was performed using the standard protocol during bolus administration of intravenous contrast. Multiplanar CT image reconstructions and MIPs were obtained to evaluate the vascular anatomy. CONTRAST:  63 milliliters ISOVUE-370 IOPAMIDOL (ISOVUE-370) INJECTION 76% COMPARISON:  Chest radiographs earlier today. FINDINGS: Cardiovascular: Good contrast bolus timing in the pulmonary arterial tree. Mild lower lobe respiratory motion. No focal filling defect identified in the pulmonary arteries to suggest acute pulmonary embolism. Calcified coronary  artery atherosclerosis (series 7, image 192). Little contrast in the aorta on this exam. Calcified aortic atherosclerosis. No cardiomegaly or pericardial effusion. Mediastinum/Nodes: Negative. No lymphadenopathy. Lungs/Pleura: Major airways are patent aside from some atelectatic changes about the hila. Mild dependent ground-glass opacity in both lungs most resembles atelectasis. No pleural effusion. No other abnormal pulmonary opacity. Upper Abdomen: Simple fluid density round 4.5 cm area in the central liver most suggestive of a benign a patent cysts. Otherwise negative visible liver, spleen, adrenal glands, and bowel in the upper abdomen. Musculoskeletal: Mild postoperative changes to the distal right clavicle. Mild Dextroconvex thoracic scoliosis. No acute osseous abnormality identified. Review of the MIP images confirms the above findings. IMPRESSION: 1. Negative for acute pulmonary embolus. 2. Calcified coronary artery and Aortic Atherosclerosis (ICD10-I70.0). 3. Mild pulmonary atelectasis. 4. Benign hepatic cyst suspected in the central liver. Electronically Signed   By: Odessa Fleming M.D.   On: 12/10/2017 20:54   Disposition   Patient is being discharged home today in good condition.  Follow-up Plans & Appointments    Follow-up Information    [facility] at [facility] Follow up.   Specialty:  Cardiology Why:  You have a hospital follow-up visit scheduled with Dr. Tomie China on 12/21/2017 at 2:20pm.  Contact information: [address] [place] [id number] [phone]         Discharge Instructions    Diet - low sodium heart healthy   Complete by:  As directed    Increase activity slowly   Complete by:  As directed       Discharge Medications   Allergies as of 12/12/2017   No Known Allergies     Medication List    STOP taking these medications   HYDROcodone-acetaminophen 5-325 MG tablet Commonly known as:  NORCO/VICODIN   pravastatin 40 MG tablet Commonly  known as:  PRAVACHOL     TAKE these medications   amLODipine-valsartan 10-320 MG tablet Commonly known as:  EXFORGE Take 1 tablet by mouth daily.   aspirin EC 81 MG tablet Take 81 mg by mouth daily.   atorvastatin 80 MG tablet Commonly known as:  LIPITOR Take 1 tablet (80 mg total) by mouth daily at 6 PM.   clopidogrel 75 MG tablet Commonly known as:  PLAVIX Take 1 tablet (75 mg total) by mouth daily.   isosorbide mononitrate 30 MG 24 hr tablet Commonly known as:  IMDUR Take 1 tablet (30 mg total) by mouth daily.   metoprolol tartrate 25 MG tablet Commonly known as:  LOPRESSOR Take 1 tablet (25 mg total) by mouth 2 (two) times daily.   nitroGLYCERIN 0.4 MG SL tablet Commonly known as:  NITROSTAT Place 1 tablet (0.4 mg total) under the tongue every 5 (five) minutes x 3 doses as needed for chest pain.  Acute coronary syndrome (MI, NSTEMI, STEMI, etc) this admission?: Yes.     AHA/ACC Clinical Performance & Quality Measures: 5. Aspirin prescribed? - Yes 6. ADP Receptor Inhibitor (Plavix/Clopidogrel, Brilinta/Ticagrelor or Effient/Prasugrel) prescribed (includes medically managed patients)? - Yes 7. Beta Blocker prescribed? - Yes 8. High Intensity Statin (Lipitor 40-80mg  or Crestor 20-40mg ) prescribed? - Yes 9. EF assessed during THIS hospitalization? - Yes 10. For EF <40%, was ACEI/ARB prescribed? - Not Applicable (EF >/= 40%) 11. For EF <40%, Aldosterone Antagonist (Spironolactone or Eplerenone) prescribed? - Not Applicable (EF >/= 40%) 12. Cardiac Rehab Phase II ordered (Included Medically managed Patients)? - Yes     Outstanding Labs/Studies   Will likely need repeat lipid panel and CMP in about 6 weeks after starting high-intensity statin.  Duration of Discharge Encounter   Greater than 30 minutes including physician time.  Signed, Corrin Parkerallie E Goodrich, PA-C 12/12/2017, 8:46 AM

## 2017-12-12 NOTE — Progress Notes (Signed)
TR BAND REMOVAL  LOCATION:    right radial  DEFLATED PER PROTOCOL:    Yes.    TIME BAND OFF / DRESSING APPLIED:    1230   SITE UPON ARRIVAL:    Level 0  SITE AFTER BAND REMOVAL:    Level 0  CIRCULATION SENSATION AND MOVEMENT:    Within Normal Limits   Yes.    COMMENTS:   Tolerated procedure well 

## 2017-12-12 NOTE — Progress Notes (Signed)
CARDIAC REHAB PHASE I   PRE:  Rate/Rhythm: 102 ST PVC  BP:  Supine:   Sitting: 140/74  Standing:    SaO2:   MODE:  Ambulation: 800 ft   POST:  Rate/Rhythm: 112 ST  BP:  Supine:   Sitting: 163/83  Standing:    SaO2:  0800-0845 Pt walked 800 ft on RA with steady gait and no CP. Tolerated well. MI education completed with pt who voiced understanding. Reviewed MI restrictions, gave heart healthy diet, ex ed, CRP 2 and NTG use.  Discussed watching carbs with A1C of 5.7. Referred to [name].   Aaron Nuttingharlene Laurel Smeltz, RN BSN  12/12/2017 8:48 AM

## 2017-12-12 NOTE — Progress Notes (Addendum)
1        Progress Note  Patient Name: Aaron Summers Date of Encounter: 12/12/2017  Primary Cardiologist: Lesleigh Noe, MD   Subjective   No recurrence of chest discomfort.  Had coronary angios yesterday.  Found totally occluded mid circumflex which was not intervened upon as the patient was asymptomatic by the time of presentation 36 hours after the onset of pain.  LV function on echocardiogram is preserved with EF 55%.  Multiple risk factors include obesity, family history of CAD, hyperlipidemia, elevated hemoglobin A1c, and possibly sleep apnea.  Inpatient Medications    Scheduled Meds: . aspirin EC  81 mg Oral Daily  . atorvastatin  80 mg Oral q1800  . clopidogrel  75 mg Oral Daily  . enoxaparin (LOVENOX) injection  40 mg Subcutaneous Q24H  . irbesartan  300 mg Oral Daily  . isosorbide mononitrate  30 mg Oral Daily  . metoprolol tartrate  25 mg Oral BID  . sodium chloride flush  3 mL Intravenous Q12H   Continuous Infusions: . sodium chloride     PRN Meds: sodium chloride, acetaminophen, nitroGLYCERIN, ondansetron (ZOFRAN) IV, sodium chloride flush   Vital Signs    Vitals:   12/11/17 2000 12/11/17 2142 12/12/17 0552 12/12/17 0812  BP:  126/67 129/72 140/74  Pulse:  85 76   Resp: (!) 21  (!) 22 18  Temp:   98.2 F (36.8 C) 98.2 F (36.8 C)  TempSrc:   Oral Oral  SpO2: 93%  91% 93%  Weight:   115 kg   Height:        Intake/Output Summary (Last 24 hours) at 12/12/2017 1108 Last data filed at 12/12/2017 0600 Gross per 24 hour  Intake 363.31 ml  Output 1520 ml  Net -1156.69 ml   Filed Weights   12/10/17 1754 12/12/17 0552  Weight: 114.8 kg 115 kg    Telemetry    Normal sinus rhythm- Personally Reviewed  ECG    Performed today demonstrates normal sinus rhythm, left anterior hemiblock, and incomplete right bundle branch block.  Incomplete right bundle is new.- Personally Reviewed  Physical Exam  Obese and in no distress. GEN: No acute  distress.   Neck: No JVD Cardiac: RRR, no murmurs, rubs, or gallops.  Respiratory: Clear to auscultation bilaterally. GI: Soft, nontender, non-distended  MS: No edema; No deformity. Neuro:  Nonfocal  Psych: Normal affect   Labs    Chemistry Recent Labs  Lab 12/10/17 1802 12/11/17 0358 12/11/17 1107  NA 137 138  --   K 3.8 4.3  --   CL 104 105  --   CO2 23 23  --   GLUCOSE 160* 156*  --   BUN 13 9  --   CREATININE 0.80 0.84 0.77  CALCIUM 9.5 9.1  --   GFRNONAA NOT CALCULATED >60 >60  GFRAA NOT CALCULATED >60 >60  ANIONGAP 10 10  --      Hematology Recent Labs  Lab 12/11/17 0358 12/11/17 1107 12/12/17 0349  WBC 7.8 7.7 10.0  RBC 5.19 5.25 4.74  HGB 15.4 15.4 14.3  HCT 45.3 45.7 41.4  MCV 87.3 87.0 87.3  MCH 29.7 29.3 30.2  MCHC 34.0 33.7 34.5  RDW 12.8 12.6 12.7  PLT 213 209 195    Cardiac EnzymesNo results for input(s): TROPONINI in the last 168 hours.  Recent Labs  Lab 12/10/17 1825  TROPIPOC 2.40*     BNPNo results for input(s): BNP, PROBNP in the last  168 hours.   DDimer No results for input(s): DDIMER in the last 168 hours.   Radiology    Dg Chest 2 View  Result Date: 12/10/2017 CLINICAL DATA:  Chest pain. Coronary artery disease and hypertension. EXAM: CHEST - 2 VIEW COMPARISON:  None. FINDINGS: Mild hyperinflation. Patient rotated left on the frontal. Midline trachea. Normal heart size. Tortuous thoracic aorta. Atherosclerosis in the transverse aorta. No pleural effusion or pneumothorax. Clear lungs. IMPRESSION: No acute cardiopulmonary disease. Aortic Atherosclerosis (ICD10-I70.0). Electronically Signed   By: Jeronimo Greaves M.D.   On: 12/10/2017 18:53   Ct Angio Chest Pe W/cm &/or Wo Cm  Result Date: 12/10/2017 CLINICAL DATA:  67 year old male with dull chest pain x2 days. Recent return from international travel ([place]). EXAM: CT ANGIOGRAPHY CHEST WITH CONTRAST TECHNIQUE: Multidetector CT imaging of the chest was performed using the standard  protocol during bolus administration of intravenous contrast. Multiplanar CT image reconstructions and MIPs were obtained to evaluate the vascular anatomy. CONTRAST:  63 milliliters ISOVUE-370 IOPAMIDOL (ISOVUE-370) INJECTION 76% COMPARISON:  Chest radiographs earlier today. FINDINGS: Cardiovascular: Good contrast bolus timing in the pulmonary arterial tree. Mild lower lobe respiratory motion. No focal filling defect identified in the pulmonary arteries to suggest acute pulmonary embolism. Calcified coronary artery atherosclerosis (series 7, image 192). Little contrast in the aorta on this exam. Calcified aortic atherosclerosis. No cardiomegaly or pericardial effusion. Mediastinum/Nodes: Negative. No lymphadenopathy. Lungs/Pleura: Major airways are patent aside from some atelectatic changes about the hila. Mild dependent ground-glass opacity in both lungs most resembles atelectasis. No pleural effusion. No other abnormal pulmonary opacity. Upper Abdomen: Simple fluid density round 4.5 cm area in the central liver most suggestive of a benign a patent cysts. Otherwise negative visible liver, spleen, adrenal glands, and bowel in the upper abdomen. Musculoskeletal: Mild postoperative changes to the distal right clavicle. Mild Dextroconvex thoracic scoliosis. No acute osseous abnormality identified. Review of the MIP images confirms the above findings. IMPRESSION: 1. Negative for acute pulmonary embolus. 2. Calcified coronary artery and Aortic Atherosclerosis (ICD10-I70.0). 3. Mild pulmonary atelectasis. 4. Benign hepatic cyst suspected in the central liver. Electronically Signed   By: Odessa Fleming M.D.   On: 12/10/2017 20:54    Cardiac Studies   Coronary angiography conclusions and recommendations 12/11/2017: Conclusion     Ost Cx to Prox Cx lesion is 100% stenosed.  Ost LAD to Prox LAD lesion is 40% stenosed.  Mid LAD lesion is 50% stenosed.  Ramus lesion is 70% stenosed.  The left ventricular systolic  function is normal.  LV end diastolic pressure is normal.  The left ventricular ejection fraction is 55-65% by visual estimate.   1. Single vessel occlusive CAD. The LCx is occluded. This appears to be a relatively small vessel with a large ramus branch supplying the lateral wall.  2. Normal LV function 3. Normal LVEDP  Plan: aggressive medical therapy and risk factor modification. DAPT for one year for ACS.       Patient Profile     67 y.o. male with essential hypertension, hyperlipidemia, atherosclerotic disease (mother abdominal aortic aneurysm and father CAD) who presents with waxing and waning chest discomfort over 36 hours and an elevated troponin consistent with non-ST elevation MI 12/10/2017.  Coronary angiography demonstrated total occlusion of circumflex presenting greater than 36 hours after onset of symptoms.  Medical therapy planned.  Assessment & Plan    1. Non-ST elevation myocardial infarction due to total occlusion of the circumflex.  Infarct is now approximately 48 hours old.  No ongoing anginal complaints. 2. Coronary artery disease, of the native heart with occlusion of the proximal circumflex and 7080% obstruction and a small to moderate size ramus intermedius.  There is moderate ostial LAD and mild distal left main.  3. Hyperlipidemia with LDL greater than 70.  Actual value is 140.  High intensity statin therapy started.  Triglyceride is normal. 4. Essential hypertension, will uptitrate beta-blocker therapy.  Overall plan is aggressive secondary risk prevention: Hemoglobin A1c less than 7, LDL less than 70 and preferably closer to 50, blood pressure target less than 130/80 mmHg, consider screening for sleep apnea, and will need to enroll in phase 2 cardiac rehab and attempt to achieve on an ongoing basis greater than 150 minutes/week of moderate aerobic activity.  Resume therapy for obstructive sleep apnea which the patient has discontinued without medical  supervision.  Plan discharge in a.m. since the patient is a completed infarct and medications are still being adjusted.  For questions or updates, please contact [facility] Please consult [email or web address] for contact info under Cardiology/STEMI.      Signed, Lesleigh NoeHenry W Wyat Infinger III, MD  12/12/2017, 11:08 AM

## 2017-12-13 LAB — CBC
HCT: 42.9 % (ref 39.0–52.0)
Hemoglobin: 14.2 g/dL (ref 13.0–17.0)
MCH: 29.2 pg (ref 26.0–34.0)
MCHC: 33.1 g/dL (ref 30.0–36.0)
MCV: 88.3 fL (ref 80.0–100.0)
Platelets: 192 10*3/uL (ref 150–400)
RBC: 4.86 MIL/uL (ref 4.22–5.81)
RDW: 12.5 % (ref 11.5–15.5)
WBC: 9 10*3/uL (ref 4.0–10.5)
nRBC: 0 % (ref 0.0–0.2)

## 2017-12-13 MED ORDER — METOPROLOL TARTRATE 25 MG PO TABS
50.0000 mg | ORAL_TABLET | Freq: Two times a day (BID) | ORAL | Status: DC
  Administered 2017-12-13: 50 mg via ORAL
  Filled 2017-12-13: qty 2

## 2017-12-13 MED ORDER — METOPROLOL TARTRATE 50 MG PO TABS: 50.0000 mg | ORAL_TABLET | Freq: Two times a day (BID) | ORAL | 2 refills | Status: DC

## 2017-12-13 NOTE — Progress Notes (Addendum)
The patient has been seen in conjunction with Marjie Skiffallie Goodrich, PAC. All aspects of care have been considered and discussed. The patient has been personally interviewed, examined, and all clinical data has been reviewed.   He is ready for discharge.  All teaching is been done.  Aggressive secondary prevention  Phase 2 cardiac rehab  TOC follow-up 1 to 2 weeks.  Progress Note  Patient Name: Aaron Summers Date of Encounter: 12/13/2017  Primary Cardiologist: Lesleigh NoeHenry W  III, MD   Subjective   No significant overnight events. Patient is feeling great. He denies any chest pain. He has no other complaints this morning and is ready to go home.    Inpatient Medications    Scheduled Meds: . aspirin EC  81 mg Oral Daily  . atorvastatin  80 mg Oral q1800  . clopidogrel  75 mg Oral Daily  . enoxaparin (LOVENOX) injection  40 mg Subcutaneous Q24H  . irbesartan  300 mg Oral Daily  . isosorbide mononitrate  30 mg Oral Daily  . metoprolol tartrate  50 mg Oral BID  . sodium chloride flush  3 mL Intravenous Q12H   Continuous Infusions: . sodium chloride     PRN Meds: sodium chloride, acetaminophen, nitroGLYCERIN, ondansetron (ZOFRAN) IV, sodium chloride flush   Vital Signs    Vitals:   12/12/17 2202 12/13/17 0311 12/13/17 0604 12/13/17 0737  BP: 124/72 126/79 127/79 (!) 151/83  Pulse: 75  69 77  Resp:  16 18 16   Temp:   98 F (36.7 C) 97.9 F (36.6 C)  TempSrc:   Oral Oral  SpO2:   94% 95%  Weight:   117.5 kg   Height:        Intake/Output Summary (Last 24 hours) at 12/13/2017 0900 Last data filed at 12/12/2017 2108 Gross per 24 hour  Intake 240 ml  Output 300 ml  Net -60 ml   Filed Weights   12/10/17 1754 12/12/17 0552 12/13/17 0604  Weight: 114.8 kg 115 kg 117.5 kg    Telemetry    Sinus rhythm with heart rate in the 70's to 90's. - Personally Reviewed  ECG    No new EKGs today. - Personally Reviewed  Physical Exam   General: Well developed, well  nourished, male in no acute distress. Head: Normocephalic and atraumatic. Eyes: Sclera clear. No xanthomas. Lungs: No increased work of breathing. Clear bilaterally to auscultation. No wheezes, rhonchi, or rales. Neck: Supple. Heart: RRR. Distinct S1 and S2. No murmurs, gallops, or rubs. Radial pulses 2+ and equal bilaterally. Right radial cath site soft with no signs of hematoma. Abdomen: Soft, non-distended, and non-tender. Bowel sounds are present. MSK: Normal strength and tone for age. Extremities: No lower extremity edema.    Skin:  Warm and dry. Neuro: Alert and oriented x3. Psych:  Good affect. Responds appropriately.  Labs    Chemistry Recent Labs  Lab 12/10/17 1802 12/11/17 0358 12/11/17 1107  NA 137 138  --   K 3.8 4.3  --   CL 104 105  --   CO2 23 23  --   GLUCOSE 160* 156*  --   BUN 13 9  --   CREATININE 0.80 0.84 0.77  CALCIUM 9.5 9.1  --   GFRNONAA NOT CALCULATED >60 >60  GFRAA NOT CALCULATED >60 >60  ANIONGAP 10 10  --      Hematology Recent Labs  Lab 12/11/17 1107 12/12/17 0349 12/13/17 0243  WBC 7.7 10.0 9.0  RBC 5.25 4.74  4.86  HGB 15.4 14.3 14.2  HCT 45.7 41.4 42.9  MCV 87.0 87.3 88.3  MCH 29.3 30.2 29.2  MCHC 33.7 34.5 33.1  RDW 12.6 12.7 12.5  PLT 209 195 192    Cardiac EnzymesNo results for input(s): TROPONINI in the last 168 hours.  Recent Labs  Lab 12/10/17 1825  TROPIPOC 2.40*     BNPNo results for input(s): BNP, PROBNP in the last 168 hours.   DDimer No results for input(s): DDIMER in the last 168 hours.   Radiology    No results found.  Cardiac Studies   Left Heart Catheterization 12/11/2017:  Ost Cx to Prox Cx lesion is 100% stenosed.  Ost LAD to Prox LAD lesion is 40% stenosed.  Mid LAD lesion is 50% stenosed.  Ramus lesion is 70% stenosed.  The left ventricular systolic function is normal.  LV end diastolic pressure is normal.  The left ventricular ejection fraction is 55-65% by visual estimate.  1.  Single vessel occlusive CAD. The LCx is occluded. This appears to be a relatively small vessel with a large ramus branch supplying the lateral wall.  2. Normal LV function 3. Normal LVEDP  Plan: aggressive medical therapy and risk factor modification. DAPT for one year for ACS.  _____________  Echocardiogram 12/11/2017: Study Conclusions: - Left ventricle: The cavity size was normal. There was mild concentric hypertrophy. Systolic function was normal. The estimated ejection fraction was in the range of 55% to 60%. Probable mild hypokinesis of the basalinferolateral myocardium; consistent with ischemia or infarction in the distribution of the left circumflex coronary artery. Doppler parameters are consistent with abnormal left ventricular relaxation (grade 1 diastolic dysfunction). - Left atrium: The atrium was mildly dilated.  Patient Profile     Mr. Aaron Summers is a 67 year old male with a history of hypertension and hyperlipidemia who presented to the [facility] ED on 12/10/2017 for evaluation of chest pain and was found to have a NSTEMI.  Assessment & Plan    NSTEMI - Left heart catheterization showed total occlusion of circumflex which was not intervened upon as the patient was asymptomatic by the time of presentation 36 hours after onset of pain. Discharge summary was completed yesterday but patient stayed one more night so we could monitor him a full 72 hours after complete infarct.  - Plan is for aggressive secondary risk prevention with Hgb A1c less than 7, LDL less than 70 and preferably closer to 50, blood pressure less than 130/80 mmHg. Consider screening for sleep apnea. Patient will also need to enroll in phase 2 cardiac rehab.  - Continue dual antiplatelet therapy with Aspirin 81mg  daily and Plavix 75mg  daily for a minimum of 12 months given ACS. - Lopressor was up-titrated yesterday from 25 to 50mg  twice daily after medications were sent to patient's  pharmacy. I  instructed patient to take 2 tablets in the morning and 2 tablets in the evening for 50mg  twice daily. Patient states he checks his blood pressure and heart rate most mornings and will continue to monitor these. - Continue Lipitor 80mg .  - Continue Imdur 30mg  daily.   - Patient has outpatient follow-up scheduled next week on 12/21/2017.   Hypertension - Will restart Amlodipine-Valsartan 10-320mg  daily at discharge. - Continue Lopressor and Imdur as above.  Hyperlipidemia - LDL 144 this admission. - Continue Lipitor as above. - Will need repeat lipid panel and CMP in about 6 weeks after starting high-intensity statin.   For questions or updates, please  contact [facility] Please consult [email or web address] for contact info under        Signed, Corrin Parker, PA-C  12/13/2017, 9:00 AM

## 2017-12-13 NOTE — Discharge Instructions (Addendum)
Medications: We increased the metoprolol tartrate (also known as Lopressor) from 25mg  twice daily to 50mg  twice daily yesterday after your medications were already sent into the pharmacy. Therefore, you can take two 25mg  tablets in the morning and two tablets in the evening. I recommend continuing to check your blood pressure and heart rate in the morning. If you notice that your are feeling extremely fatigued, lightheaded, or dizzy, I recommend checking your blood pressure and heart rate to make sure they are not too low.  Please take all other medications as directed above.  Post NSTEMI:  NO HEAVY LIFTING X 2 WEEKS. NO SEXUAL ACTIVITY X 2 WEEKS. NO DRIVING X 1 WEEK. NO SOAKING BATHS, HOT TUBS, POOLS, ETC., X 7 DAYS.  Radial Site Care: Refer to this sheet in the next few weeks. These instructions provide you with information on caring for yourself after your procedure. Your caregiver may also give you more specific instructions. Your treatment has been planned according to current medical practices, but problems sometimes occur. Call your caregiver if you have any problems or questions after your procedure. HOME CARE INSTRUCTIONS  You may shower the day after the procedure.Remove the bandage (dressing) and gently wash the site with plain soap and water.Gently pat the site dry.   Do not apply powder or lotion to the site.   Do not submerge the affected site in water for 3 to 5 days.   Inspect the site at least twice daily.   Do not flex or bend the affected arm for 24 hours.   No lifting over 5 pounds (2.3 kg) for 5 days after your procedure.   Do not drive home if you are discharged the same day of the procedure. Have someone else drive you.   You may drive 24 hours after the procedure unless otherwise instructed by your caregiver.  What to expect:  Any bruising will usually fade within 1 to 2 weeks.   Blood that collects in the tissue (hematoma) may be painful to the touch. It  should usually decrease in size and tenderness within 1 to 2 weeks.  SEEK IMMEDIATE MEDICAL CARE IF:  You have unusual pain at the radial site.   You have redness, warmth, swelling, or pain at the radial site.   You have drainage (other than a small amount of blood on the dressing).   You have chills.   You have a fever or persistent symptoms for more than 72 hours.   You have a fever and your symptoms suddenly get worse.   Your arm becomes pale, cool, tingly, or numb.   You have heavy bleeding from the site. Hold pressure on the site.    Information about your medication: Plavix (anti-platelet agent)  Generic Name (Brand): clopidogrel (Plavix), once daily medication  PURPOSE: You are taking this medication along with aspirin to lower your chance of having a heart attack, stroke, or blood clots in your heart stent. These can be fatal. Brilinta and aspirin help prevent platelets from sticking together and forming a clot that can block an artery or your stent.   Common SIDE EFFECTS you may experience include: bruising or bleeding more easily, shortness of breath  Do not stop taking PLAVIX without talking to the doctor who prescribes it for you. People who are treated with a stent and stop taking Plavix too soon, have a higher risk of getting a blood clot in the stent, having a heart attack, or dying. If you stop Plavix because  of bleeding, or for other reasons, your risk of a heart attack or stroke may increase.   Tell all of your doctors and dentists that you are taking Plavix. They should talk to the doctor who prescribed plavix for you before you have any surgery or invasive procedure.   Contact your health care provider if you experience: severe or uncontrollable bleeding, pink/red/brown urine, vomiting blood or vomit that looks like "coffee grounds", red or black stools (looks like tar), coughing up blood or blood  clots -------------------------------------------------------------------------------------------------------

## 2017-12-21 ENCOUNTER — Ambulatory Visit: Payer: BLUE CROSS/BLUE SHIELD | Admitting: Cardiology

## 2017-12-21 ENCOUNTER — Encounter: Payer: Self-pay | Admitting: Cardiology

## 2017-12-21 VITALS — BP 152/86 | HR 77 | Ht 74.0 in | Wt 252.0 lb

## 2017-12-21 DIAGNOSIS — E782 Mixed hyperlipidemia: Secondary | ICD-10-CM

## 2017-12-21 DIAGNOSIS — I214 Non-ST elevation (NSTEMI) myocardial infarction: Secondary | ICD-10-CM | POA: Diagnosis not present

## 2017-12-21 DIAGNOSIS — I10 Essential (primary) hypertension: Secondary | ICD-10-CM

## 2017-12-21 DIAGNOSIS — I2510 Atherosclerotic heart disease of native coronary artery without angina pectoris: Secondary | ICD-10-CM | POA: Diagnosis not present

## 2017-12-21 NOTE — Patient Instructions (Signed)
Medication Instructions:  Your physician recommends that you continue on your current medications as directed. Please refer to the Current Medication list given to you today.  If you need a refill on your cardiac medications before your next appointment, please call your pharmacy.   Lab work: Your physician recommends that you have the following labs drawn: BMP, TSH, CBC, liver and lipid panel to be done in 1 month, no appointment needed. Come fasting please.   If you have labs (blood work) drawn today and your tests are completely normal, you will receive your results only by: Marland Kitchen. MyChart Message (if you have MyChart) OR . A paper copy in the mail If you have any lab test that is abnormal or we need to change your treatment, we will call you to review the results.  Testing/Procedures: None  Follow-Up: At [facility], you and your health needs are our priority.  As part of our continuing mission to provide you with exceptional heart care, we have created designated Provider Care Teams.  These Care Teams include your primary Cardiologist (physician) and Advanced Practice Providers (APPs -  Physician Assistants and Nurse Practitioners) who all work together to provide you with the care you need, when you need it.  You will need a follow up appointment in 3 months.  Please call our office 2 months in advance to schedule this appointment.  You may see Aaron NoeHenry W Smith III, MD or another member of our [facility] Provider Team in [place]: Aaron Balsamobert Krasowski, MD . Aaron HerrlichBrian Munley, MD  Any Other Special Instructions Will Be Listed Below (If Applicable).

## 2017-12-21 NOTE — Progress Notes (Signed)
She is Dr. Valentina LucksGriffin is a doctor yes your  Cardiology Office Note:    Date:  12/21/2017   ID:  Aaron Summers, DOB June 09, 1950, MRN [id number]  PCP:  Aaron FunkGriffin, [name], MD  Cardiologist:  Garwin Brothersajan R Adalida Garver, MD   Referring MD: Aaron FunkGriffin, [name], MD    ASSESSMENT:    1. NSTEMI (non-ST elevated myocardial infarction) (HCC)   2. Essential hypertension   3. Mixed hyperlipidemia   4. Coronary artery disease involving native coronary artery of native heart without angina pectoris    PLAN:    In order of problems listed above:  1. Secondary prevention stressed with the patient.  Importance of compliance with diet and medication stressed and he vocalized understanding.  His blood pressure is stable.  He has an element of whitecoat hypertension.  He tells me that his blood pressure is fine at home.  Diet was discussed for obesity and risks of obesity explained and he vocalized understanding.  He will be back in 1 month for liver lipid check amongst other blood work. 2. Importance of regular exercise stressed and this was outlined to him.  He is a Occupational hygienistpilot.  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  In the interim he is walking almost 25 to 30 minutes on a daily basis without any problems. 3. Patient will be seen in follow-up appointment in 3 months or earlier if the patient has any concerns    Medication Adjustments/Labs and Tests Ordered: Current medicines are reviewed at length with the patient today.  Concerns regarding medicines are outlined above.  No orders of the defined types were placed in this encounter.  No orders of the defined types were placed in this encounter.    No chief complaint on file.    History of Present Illness:    Aaron Summers is a 67 y.o. male.  The patient has history of essential hypertension and dyslipidemia.  [facility] and was found to  have a non-STEMI.  The patient underwent coronary angiography with total occlusion of circumflex artery.  Because of the size of the vessel medical therapy was recommended.  Patient since discharge has done fine and is walking some on a regular basis.  He denies any chest pain orthopnea or PND.  At the time of my evaluation, the patient is alert awake oriented and in no distress.  Past Medical History:  Diagnosis Date  . CAD    a. LHC 12/11/2017: Ost Cx to Prox Cx 100% stenosed with large ramus branch supplying lateral wall, Ost LAD to Prox LAD 40% stenosed, mid LAD 50% stenosed, Ramus 70% stenosed  . Hyperlipidemia   . Hypertension   . NSTEMI (non-ST elevated myocardial infarction) (HCC) 12/10/2017    Past Surgical History:  Procedure Laterality Date  . FOREIGN BODY REMOVAL Left 05/16/2012   Procedure: FOREIGN BODY REMOVAL LEFT THUMB;  Surgeon: Tami RibasKevin R Kuzma, MD;  Location: [facility];  Service: Orthopedics;  Laterality: Left;  . FRACTURE SURGERY    . INGUINAL HERNIA REPAIR Left 1954  . LEFT HEART CATH AND CORONARY ANGIOGRAPHY N/A 12/11/2017   Procedure: LEFT HEART CATH AND CORONARY ANGIOGRAPHY;  Surgeon: SwazilandJordan, Peter M, MD;  [facility];  Service: Cardiovascular;  Laterality: N/A;  . ORIF CLAVICULAR FRACTURE Right 1971    Current Medications:  Current Meds  Medication Sig  . aspirin EC 81 MG tablet Take 81 mg by mouth daily.  Marland Kitchen atorvastatin (LIPITOR) 80 MG tablet Take 1 tablet (80 mg total) by mouth daily at 6 PM.  . clopidogrel (PLAVIX) 75 MG tablet Take 1 tablet (75 mg total) by mouth daily.  . isosorbide mononitrate (IMDUR) 30 MG 24 hr tablet Take 1 tablet (30 mg total) by mouth daily.  . metoprolol tartrate (LOPRESSOR) 50 MG tablet Take 1 tablet (50 mg total) by mouth 2 (two) times daily.  . nitroGLYCERIN (NITROSTAT) 0.4 MG SL tablet Place 1 tablet (0.4 mg total) under the tongue every 5 (five) minutes x 3 doses as needed for chest pain.      Allergies:   Patient has no known allergies.   Social History   Socioeconomic History  . Marital status: Married    Spouse name: Not on file  . Number of children: Not on file  . Years of education: Not on file  . Highest education level: Not on file  Occupational History  . Not on file  Social Needs  . Financial resource strain: Not on file  . Food insecurity:    Worry: Not on file    Inability: Not on file  . Transportation needs:    Medical: Not on file    Non-medical: Not on file  Tobacco Use  . Smoking status: Never Smoker  . Smokeless tobacco: Never Used  Substance and Sexual Activity  . Alcohol use: Never    Frequency: Never  . Drug use: No  . Sexual activity: Yes  Lifestyle  . Physical activity:    Days per week: Not on file    Minutes per session: Not on file  . Stress: Not on file  Relationships  . Social connections:    Talks on phone: Not on file    Gets together: Not on file    Attends religious service: Not on file    Active member of club or organization: Not on file    Attends meetings of clubs or organizations: Not on file    Relationship status: Not on file  Other Topics Concern  . Not on file  Social History Narrative  . Not on file     Family History: The patient's family history is not on file.  ROS:   Please see the history of present illness.    All other systems reviewed and are negative.  EKGs/Labs/Other Studies Reviewed:    The following studies were reviewed today: I discussed my findings with the patient at extensive length.   Recent Labs: 12/11/2017: BUN 9; Creatinine, Ser 0.77; Potassium 4.3; Sodium 138; TSH 3.989 12/13/2017: Hemoglobin 14.2; Platelets 192  Recent Lipid Panel    Component Value Date/Time   CHOL 195 12/11/2017 0358   TRIG 111 12/11/2017 0358   HDL 29 (L) 12/11/2017 0358   CHOLHDL 6.7 12/11/2017 0358   VLDL 22 12/11/2017 0358   LDLCALC 144 (H) 12/11/2017 0358    Physical Exam:    VS:  BP (!)  152/86 (BP Location: Right Arm, Patient Position: Sitting, Cuff Size: Normal)   Pulse 77   Ht 6\' 2"  (1.88 m)   Wt 252 lb (114.3 kg)   SpO2 96%   BMI 32.35 kg/m     Wt Readings from Last 3 Encounters:  12/21/17 252 lb (114.3 kg)  12/13/17 259 lb 0.7 oz (117.5 kg)  05/16/12 249 lb (112.9 kg)     GEN: Patient  is in no acute distress HEENT: Normal NECK: No JVD; No carotid bruits LYMPHATICS: No lymphadenopathy CARDIAC: Hear sounds regular, 2/6 systolic murmur at the apex. RESPIRATORY:  Clear to auscultation without rales, wheezing or rhonchi  ABDOMEN: Soft, non-tender, non-distended MUSCULOSKELETAL:  No edema; No deformity  SKIN: Warm and dry NEUROLOGIC:  Alert and oriented x 3 PSYCHIATRIC:  Normal affect   Signed, Garwin Brothersajan R Abigaile Rossie, MD  12/21/2017 3:00 PM    [facility]

## 2018-01-20 ENCOUNTER — Other Ambulatory Visit (HOSPITAL_COMMUNITY): Payer: Self-pay | Admitting: Student

## 2018-03-03 ENCOUNTER — Other Ambulatory Visit (HOSPITAL_COMMUNITY): Payer: Self-pay | Admitting: Student

## 2018-03-05 ENCOUNTER — Other Ambulatory Visit (HOSPITAL_COMMUNITY): Payer: Self-pay | Admitting: Cardiovascular Disease

## 2018-03-05 ENCOUNTER — Ambulatory Visit: Payer: BLUE CROSS/BLUE SHIELD | Admitting: Cardiology

## 2018-03-05 ENCOUNTER — Encounter: Payer: Self-pay | Admitting: Cardiology

## 2018-03-05 ENCOUNTER — Other Ambulatory Visit (HOSPITAL_COMMUNITY): Payer: Self-pay | Admitting: Student

## 2018-03-05 VITALS — BP 127/82 | HR 65 | Ht 74.0 in | Wt 256.0 lb

## 2018-03-05 DIAGNOSIS — I2510 Atherosclerotic heart disease of native coronary artery without angina pectoris: Secondary | ICD-10-CM | POA: Diagnosis not present

## 2018-03-05 DIAGNOSIS — I10 Essential (primary) hypertension: Secondary | ICD-10-CM | POA: Insufficient documentation

## 2018-03-05 DIAGNOSIS — E782 Mixed hyperlipidemia: Secondary | ICD-10-CM

## 2018-03-05 HISTORY — DX: Mixed hyperlipidemia: E78.2

## 2018-03-05 HISTORY — DX: Essential (primary) hypertension: I10

## 2018-03-05 NOTE — Progress Notes (Signed)
Cardiology Office Note:    Date:  03/05/2018   ID:  Aaron Summers, DOB [birth year], MRN [id number]  PCP:  Kirby Funk, MD  Cardiologist:  Garwin Brothers, MD   Referring MD: Kirby Funk, MD    ASSESSMENT:    1. Coronary artery disease involving native coronary artery of native heart without angina pectoris   2. Essential hypertension   3. Mixed hyperlipidemia    PLAN:    In order of problems listed above:  1. Secondary prevention stressed with the patient.  Importance of compliance with diet and medication stressed and he vocalized understanding. 2. His blood pressure is stable.  Diet was discussed for dyslipidemia and overweight status.  He has abdominal obesity and diet encouraged him to do better with exercise and weight loss and he vocalized understanding. 3. He will have blood work today including fasting lipids. 4. Patient will be seen in follow-up appointment in 6 months or earlier if the patient has any concerns    Medication Adjustments/Labs and Tests Ordered: Current medicines are reviewed at length with the patient today.  Concerns regarding medicines are outlined above.  Orders Placed This Encounter  Procedures  . Basic metabolic panel  . TSH  . Hepatic function panel  . Lipid Profile   No orders of the defined types were placed in this encounter.    No chief complaint on file.    History of Present Illness:    Aaron Summers is a 68 y.o. male.  Patient has past medical history of coronary artery disease, essential hypertension and dyslipidemia.  He denies any problems at this time and takes care of activities of daily living.  No chest pain orthopnea or PND.  He leads a sedentary lifestyle.  At the time of my evaluation, the patient is alert awake oriented and in no distress.  Past Medical History:  Diagnosis Date  . CAD    a. LHC 12/11/2017: Ost Cx to Prox Cx 100% stenosed with large ramus branch supplying lateral wall, Ost LAD to Prox LAD  40% stenosed, mid LAD 50% stenosed, Ramus 70% stenosed  . Hyperlipidemia   . Hypertension   . NSTEMI (non-ST elevated myocardial infarction) (HCC) 12/10/2017    Past Surgical History:  Procedure Laterality Date  . FOREIGN BODY REMOVAL Left 05/16/2012   Procedure: FOREIGN BODY REMOVAL LEFT THUMB;  Surgeon: Tami Ribas, MD;  Location: [facility];  Service: Orthopedics;  Laterality: Left;  . FRACTURE SURGERY    . INGUINAL HERNIA REPAIR Left 1954  . LEFT HEART CATH AND CORONARY ANGIOGRAPHY N/A 12/11/2017   Procedure: LEFT HEART CATH AND CORONARY ANGIOGRAPHY;  Surgeon: Swaziland, Peter M, MD;  Location: [facility]VASIVE CV LAB;  Service: Cardiovascular;  Laterality: N/A;  . ORIF CLAVICULAR FRACTURE Right 1971    Current Medications: Current Meds  Medication Sig  . aspirin EC 81 MG tablet Take 81 mg by mouth daily.  Marland Kitchen atorvastatin (LIPITOR) 80 MG tablet Take 1 tablet (80 mg total) by mouth daily at 6 PM.  . clopidogrel (PLAVIX) 75 MG tablet Take 1 tablet (75 mg total) by mouth daily.  . isosorbide mononitrate (IMDUR) 30 MG 24 hr tablet Take 1 tablet (30 mg total) by mouth daily.  . metoprolol tartrate (LOPRESSOR) 50 MG tablet Take 1 tablet (50 mg total) by mouth 2 (two) times daily. Pt needs to keep updated appt for future refills  . nitroGLYCERIN (NITROSTAT) 0.4 MG SL tablet Place 1 tablet (0.4 mg total)  under the tongue every 5 (five) minutes x 3 doses as needed for chest pain.     Allergies:   Patient has no known allergies.   Social History   Socioeconomic History  . Marital status: Married    Spouse name: Not on file  . Number of children: Not on file  . Years of education: Not on file  . Highest education level: Not on file  Occupational History  . Not on file  Social Needs  . Financial resource strain: Not on file  . Food insecurity:    Worry: Not on file    Inability: Not on file  . Transportation needs:    Medical: Not on file    Non-medical: Not on file    Tobacco Use  . Smoking status: Never Smoker  . Smokeless tobacco: Never Used  Substance and Sexual Activity  . Alcohol use: Never    Frequency: Never  . Drug use: No  . Sexual activity: Yes  Lifestyle  . Physical activity:    Days per week: Not on file    Minutes per session: Not on file  . Stress: Not on file  Relationships  . Social connections:    Talks on phone: Not on file    Gets together: Not on file    Attends religious service: Not on file    Active member of club or organization: Not on file    Attends meetings of clubs or organizations: Not on file    Relationship status: Not on file  Other Topics Concern  . Not on file  Social History Narrative  . Not on file     Family History: The patient's family history is not on file.  ROS:   Please see the history of present illness.    All other systems reviewed and are negative.  EKGs/Labs/Other Studies Reviewed:    The following studies were reviewed today: I discussed my findings with the patient at extensive length.   Recent Labs: 12/11/2017: BUN 9; Creatinine, Ser 0.77; Potassium 4.3; Sodium 138; TSH 3.989 12/13/2017: Hemoglobin 14.2; Platelets 192  Recent Lipid Panel    Component Value Date/Time   CHOL 195 12/11/2017 0358   TRIG 111 12/11/2017 0358   HDL 29 (L) 12/11/2017 0358   CHOLHDL 6.7 12/11/2017 0358   VLDL 22 12/11/2017 0358   LDLCALC 144 (H) 12/11/2017 0358    Physical Exam:    VS:  BP 127/82 (BP Location: Right Arm, Patient Position: Sitting, Cuff Size: Normal)   Pulse 65   Ht 6\' 2"  (1.88 m)   Wt 256 lb (116.1 kg)   SpO2 98%   BMI 32.87 kg/m     Wt Readings from Last 3 Encounters:  03/05/18 256 lb (116.1 kg)  12/21/17 252 lb (114.3 kg)  12/13/17 259 lb 0.7 oz (117.5 kg)     GEN: Patient is in no acute distress HEENT: Normal NECK: No JVD; No carotid bruits LYMPHATICS: No lymphadenopathy CARDIAC: Hear sounds regular, 2/6 systolic murmur at the apex. RESPIRATORY:  Clear to  auscultation without rales, wheezing or rhonchi  ABDOMEN: Soft, non-tender, non-distended MUSCULOSKELETAL:  No edema; No deformity  SKIN: Warm and dry NEUROLOGIC:  Alert and oriented x 3 PSYCHIATRIC:  Normal affect   Signed, Garwin Brothers, MD  03/05/2018 9:56 AM    [facility]

## 2018-03-05 NOTE — Patient Instructions (Signed)
Medication Instructions: Your physician recommends that you continue on your current medications as directed. Please refer to the Current Medication list given to you today.  If you need a refill on your cardiac medications before your next appointment, please call your pharmacy.   Lab work: Your physician recommends that you return for lab work today: bmp, tsh, lft and lipids   If you have labs (blood work) drawn today and your tests are completely normal, you will receive your results only by: Marland Kitchen MyChart Message (if you have MyChart) OR . A paper copy in the mail If you have any lab test that is abnormal or we need to change your treatment, we will call you to review the results.  Testing/Procedures: None.   Follow-Up: At [facility], you and your health needs are our priority.  As part of our continuing mission to provide you with exceptional heart care, we have created designated Provider Care Teams.  These Care Teams include your primary Cardiologist (physician) and Advanced Practice Providers (APPs -  Physician Assistants and Nurse Practitioners) who all work together to provide you with the care you need, when you need it. You will need a follow up appointment in 6 months.  Please call our office 2 months in advance to schedule this appointment.  You may see Lesleigh Noe, MD or another member of our [facility] Provider Team in [place]: Gypsy Balsam, MD . Norman Herrlich, MD  Any Other Special Instructions Will Be Listed Below (If Applicable).

## 2018-03-06 ENCOUNTER — Ambulatory Visit: Payer: BLUE CROSS/BLUE SHIELD | Admitting: Cardiology

## 2018-03-06 LAB — BASIC METABOLIC PANEL
BUN/Creatinine Ratio: 16 (ref 10–24)
BUN: 13 mg/dL (ref 8–27)
CO2: 23 mmol/L (ref 20–29)
Calcium: 9.3 mg/dL (ref 8.6–10.2)
Chloride: 105 mmol/L (ref 96–106)
Creatinine, Ser: 0.79 mg/dL (ref 0.76–1.27)
GFR calc Af Amer: 108 mL/min/{1.73_m2} (ref >59)
GFR calc non Af Amer: 94 mL/min/{1.73_m2} (ref >59)
Glucose: 127 mg/dL — ABNORMAL HIGH (ref 65–99)
Potassium: 4.8 mmol/L (ref 3.5–5.2)
Sodium: 141 mmol/L (ref 134–144)

## 2018-03-06 LAB — LIPID PANEL
Chol/HDL Ratio: 3.7 ratio (ref 0.0–5.0)
Cholesterol, Total: 112 mg/dL (ref 100–199)
HDL: 30 mg/dL — ABNORMAL LOW (ref >39)
LDL Calculated: 54 mg/dL (ref 0–99)
Triglycerides: 141 mg/dL (ref 0–149)
VLDL Cholesterol Cal: 28 mg/dL (ref 5–40)

## 2018-03-06 LAB — HEPATIC FUNCTION PANEL
ALT: 32 IU/L (ref 0–44)
AST: 24 IU/L (ref 0–40)
Albumin: 4 g/dL (ref 3.8–4.8)
Alkaline Phosphatase: 60 IU/L (ref 39–117)
Bilirubin Total: 0.6 mg/dL (ref 0.0–1.2)
Bilirubin, Direct: 0.15 mg/dL (ref 0.00–0.40)
Total Protein: 6.9 g/dL (ref 6.0–8.5)

## 2018-03-06 LAB — TSH: TSH: 3.36 u[IU]/mL (ref 0.450–4.500)

## 2018-04-08 ENCOUNTER — Other Ambulatory Visit: Payer: Self-pay | Admitting: Cardiology

## 2018-04-08 MED ORDER — METOPROLOL TARTRATE 50 MG PO TABS: 50.0000 mg | ORAL_TABLET | Freq: Two times a day (BID) | ORAL | 1 refills | Status: DC

## 2018-04-08 NOTE — Telephone Encounter (Signed)
Rx sent to pharmacy   

## 2018-04-08 NOTE — Telephone Encounter (Signed)
°*  STAT* If patient is at the pharmacy, call can be transferred to refill team.   1. Which medications need to be refilled? (please list name of each medication and dose if known) Metoprolol Tartrate50mg  2. Which pharmacy/location (including street and city if local pharmacy) is medication to be sent to? [facility] [address]  3. Do they need a 30 day or 90 day supply? 90

## 2018-05-26 ENCOUNTER — Other Ambulatory Visit: Payer: Self-pay | Admitting: Cardiovascular Disease

## 2018-08-24 ENCOUNTER — Other Ambulatory Visit: Payer: Self-pay | Admitting: Cardiovascular Disease

## 2018-08-27 NOTE — Telephone Encounter (Signed)
Please review for refill. Thank you! 

## 2018-08-27 NOTE — Telephone Encounter (Signed)
Rx refill sent to pharmacy. 

## 2018-09-02 ENCOUNTER — Ambulatory Visit: Payer: BLUE CROSS/BLUE SHIELD | Admitting: Cardiology

## 2018-09-05 ENCOUNTER — Ambulatory Visit: Payer: BLUE CROSS/BLUE SHIELD | Admitting: Cardiology

## 2018-09-12 ENCOUNTER — Ambulatory Visit: Payer: BLUE CROSS/BLUE SHIELD | Admitting: Cardiology

## 2018-09-30 ENCOUNTER — Telehealth: Payer: Self-pay

## 2018-09-30 MED ORDER — METOPROLOL TARTRATE 50 MG PO TABS: 50.0000 mg | ORAL_TABLET | Freq: Two times a day (BID) | ORAL | 0 refills | Status: DC

## 2018-09-30 MED ORDER — ATORVASTATIN CALCIUM 80 MG PO TABS: ORAL_TABLET | ORAL | 0 refills | Status: DC

## 2018-09-30 NOTE — Addendum Note (Signed)
Addended by: Raelene Bott, Vadie Principato L on: 09/30/2018 01:32 PM   Modules accepted: Orders

## 2018-09-30 NOTE — Telephone Encounter (Signed)
Requested Prescriptions   Signed Prescriptions Disp Refills  . atorvastatin (LIPITOR) 80 MG tablet 30 tablet 0    Sig: TAKE 1 TABLET BY MOUTH EVERY DAY AT 6 PM    Authorizing Provider: Jenean Lindau    Ordering User: NEWCOMER MCCLAIN, BRANDY L  . metoprolol tartrate (LOPRESSOR) 50 MG tablet 180 tablet 0    Sig: Take 1 tablet (50 mg total) by mouth 2 (two) times daily. Pt needs to keep updated appt for future refills    Authorizing Provider: Jenean Lindau    Ordering User: Raelene Bott, BRANDY L

## 2018-09-30 NOTE — Telephone Encounter (Signed)
Requested Prescriptions   Signed Prescriptions Disp Refills  . atorvastatin (LIPITOR) 80 MG tablet 30 tablet 0    Sig: TAKE 1 TABLET BY MOUTH EVERY DAY AT 6 PM    Authorizing Provider: Jenean Lindau    Ordering User: Raelene Bott, Akiva Josey L

## 2018-10-07 ENCOUNTER — Ambulatory Visit: Payer: BLUE CROSS/BLUE SHIELD | Admitting: Cardiology

## 2018-11-05 ENCOUNTER — Ambulatory Visit: Payer: BLUE CROSS/BLUE SHIELD | Admitting: Cardiology

## 2018-11-20 ENCOUNTER — Other Ambulatory Visit: Payer: Self-pay | Admitting: Student

## 2018-11-20 ENCOUNTER — Other Ambulatory Visit: Payer: Self-pay | Admitting: Cardiology

## 2018-11-20 NOTE — Telephone Encounter (Signed)
Rx refill sent to pharmacy. 

## 2018-12-16 ENCOUNTER — Ambulatory Visit: Payer: BLUE CROSS/BLUE SHIELD | Admitting: Cardiology

## 2018-12-20 ENCOUNTER — Other Ambulatory Visit: Payer: Self-pay | Admitting: Cardiology

## 2019-02-20 ENCOUNTER — Other Ambulatory Visit: Payer: Self-pay

## 2019-02-20 MED ORDER — ISOSORBIDE MONONITRATE ER 30 MG PO TB24: 30.0000 mg | ORAL_TABLET | Freq: Every day | ORAL | 5 refills | Status: DC

## 2019-05-03 ENCOUNTER — Other Ambulatory Visit: Payer: Self-pay | Admitting: Student

## 2019-05-08 ENCOUNTER — Other Ambulatory Visit: Payer: Self-pay | Admitting: Cardiology

## 2019-07-23 ENCOUNTER — Telehealth: Payer: Self-pay | Admitting: *Deleted

## 2019-07-23 MED ORDER — CLOPIDOGREL BISULFATE 75 MG PO TABS: 75.0000 mg | ORAL_TABLET | Freq: Every day | ORAL | 6 refills | Status: DC

## 2019-07-23 NOTE — Telephone Encounter (Signed)
Rx refill sent to pharmacy. 

## 2019-08-18 ENCOUNTER — Other Ambulatory Visit: Payer: Self-pay | Admitting: Cardiology

## 2019-09-17 ENCOUNTER — Other Ambulatory Visit: Payer: Self-pay | Admitting: Cardiology

## 2019-09-19 ENCOUNTER — Telehealth: Payer: Self-pay | Admitting: *Deleted

## 2019-09-19 NOTE — Telephone Encounter (Signed)
Unable to leave message due to mailbox being full and cannot accept messages.

## 2019-09-19 NOTE — Telephone Encounter (Signed)
-----   Message from Fonda Kinder, CMA sent at 09/19/2019  8:59 AM EDT ----- Regarding: Appointment needed Please reach out to patient to schedule follow up appointment, patients last office visit was 03/05/2018, patient needs appointment prior to any further prescription refills. KW

## 2020-01-13 DIAGNOSIS — E785 Hyperlipidemia, unspecified: Secondary | ICD-10-CM | POA: Insufficient documentation

## 2020-01-13 DIAGNOSIS — I10 Essential (primary) hypertension: Secondary | ICD-10-CM | POA: Insufficient documentation

## 2020-01-13 DIAGNOSIS — I2510 Atherosclerotic heart disease of native coronary artery without angina pectoris: Secondary | ICD-10-CM | POA: Insufficient documentation

## 2020-01-14 ENCOUNTER — Encounter: Payer: Self-pay | Admitting: Cardiology

## 2020-01-14 ENCOUNTER — Ambulatory Visit: Payer: BLUE CROSS/BLUE SHIELD | Admitting: Cardiology

## 2020-01-14 ENCOUNTER — Other Ambulatory Visit: Payer: Self-pay

## 2020-01-14 VITALS — BP 160/100 | HR 89 | Ht 74.0 in | Wt 264.0 lb

## 2020-01-14 DIAGNOSIS — I10 Essential (primary) hypertension: Secondary | ICD-10-CM

## 2020-01-14 DIAGNOSIS — E782 Mixed hyperlipidemia: Secondary | ICD-10-CM | POA: Diagnosis not present

## 2020-01-14 DIAGNOSIS — I2510 Atherosclerotic heart disease of native coronary artery without angina pectoris: Secondary | ICD-10-CM | POA: Diagnosis not present

## 2020-01-14 MED ORDER — METOPROLOL TARTRATE 50 MG PO TABS: ORAL_TABLET | ORAL | 1 refills | Status: DC

## 2020-01-14 MED ORDER — NITROGLYCERIN 0.4 MG SL SUBL: 0.4000 mg | SUBLINGUAL_TABLET | SUBLINGUAL | 3 refills | Status: DC | PRN

## 2020-01-14 NOTE — Patient Instructions (Signed)

## 2020-01-14 NOTE — Progress Notes (Signed)
Cardiology Office Note:    Date:  01/14/2020   ID:  Aaron Summers, DOB 23-May-1950, MRN [id number]  PCP:  Kirby Funk, MD  Cardiologist:  Garwin Brothers, MD   Referring MD: Kirby Funk, MD    ASSESSMENT:    1. Coronary artery disease involving native coronary artery of native heart without angina pectoris   2. Essential hypertension   3. Mixed dyslipidemia    PLAN:    In order of problems listed above:  1. Coronary artery disease: Secondary prevention stressed with the patient.  Importance of compliance with diet and medications yesterday vocalized understanding.  Advised the patient about compliance.  I told him to walk at least half an hour a day 5 days a week.  Weight reduction was stressed and he promises to do better.  I will refill his beta-blocker and he is willing to start back 2. Essential hypertension: Blood pressure is stable and diet was emphasized.  I think beta-blocker will help with blood pressure 2.  Lifestyle modification and exercise stressed as above. 3. Mixed dyslipidemia: Diet was emphasized.  He plans to do better and lose weight also.  Risks of obesity emphasized.  He will be back in 3 months or earlier if he has any concerns.  He is going to see his primary care tomorrow and he will go fasting and get complete blood work and send me a copy and I will review it. 4. He had multiple questions which were answered to his satisfaction.   Medication Adjustments/Labs and Tests Ordered: Current medicines are reviewed at length with the patient today.  Concerns regarding medicines are outlined above.  No orders of the defined types were placed in this encounter.  No orders of the defined types were placed in this encounter.    Chief Complaint  Patient presents with  . Annual Exam    Past due     History of Present Illness:    Aaron Summers is a 70 y.o. male.  Patient has past medical history of coronary artery disease, essential hypertension  dyslipidemia and obesity.  He denies any problems at this time and takes care of activities of daily living.  No chest pain orthopnea or PND.  At the time of my evaluation, the patient is alert awake oriented and in no distress.  He leads a sedentary lifestyle in the past several months he has gained weight.  He has come off his beta-blocker for no clear reason and wants to get back on it.  Past Medical History:  Diagnosis Date  . CAD    a. LHC 12/11/2017: Ost Cx to Prox Cx 100% stenosed with large ramus branch supplying lateral wall, Ost LAD to Prox LAD 40% stenosed, mid LAD 50% stenosed, Ramus 70% stenosed  . CAD (coronary artery disease) 12/12/2017  . Essential hypertension 03/05/2018  . HTN (hypertension) 12/10/2017  . Hyperlipemia 12/10/2017  . Hyperlipidemia   . Hypertension   . Mixed dyslipidemia 03/05/2018  . NSTEMI (non-ST elevated myocardial infarction) (HCC) 12/10/2017    Past Surgical History:  Procedure Laterality Date  . FOREIGN BODY REMOVAL Left 05/16/2012   Procedure: FOREIGN BODY REMOVAL LEFT THUMB;  Surgeon: Tami Ribas, MD;  Location: [facility];  Service: Orthopedics;  Laterality: Left;  . FRACTURE SURGERY    . INGUINAL HERNIA REPAIR Left 1954  . LEFT HEART CATH AND CORONARY ANGIOGRAPHY N/A 12/11/2017   Procedure: LEFT HEART CATH AND CORONARY ANGIOGRAPHY;  Surgeon: Swaziland, Peter M,  MD;  Location: [facility] INVASIVE CV LAB;  Service: Cardiovascular;  Laterality: N/A;  . ORIF CLAVICULAR FRACTURE Right 1971    Current Medications: Current Meds  Medication Sig  . aspirin EC 81 MG tablet Take 81 mg by mouth daily.  . clopidogrel (PLAVIX) 75 MG tablet Take 1 tablet (75 mg total) by mouth daily.  . isosorbide mononitrate (IMDUR) 30 MG 24 hr tablet Take 1 tablet (30 mg total) by mouth daily. Patient needs appointment prior to any further refills. (2nd Attempt).  . nitroGLYCERIN (NITROSTAT) 0.4 MG SL tablet Place 1 tablet (0.4 mg total) under the tongue every 5 (five)  minutes x 3 doses as needed for chest pain.     Allergies:   Patient has no known allergies.   Social History   Socioeconomic History  . Marital status: Married    Spouse name: Not on file  . Number of children: Not on file  . Years of education: Not on file  . Highest education level: Not on file  Occupational History  . Not on file  Tobacco Use  . Smoking status: Never Smoker  . Smokeless tobacco: Never Used  Vaping Use  . Vaping Use: Never used  Substance and Sexual Activity  . Alcohol use: Never  . Drug use: No  . Sexual activity: Yes  Other Topics Concern  . Not on file  Social History Narrative  . Not on file   Social Determinants of Health   Financial Resource Strain: Not on file  Food Insecurity: Not on file  Transportation Needs: Not on file  Physical Activity: Not on file  Stress: Not on file  Social Connections: Not on file     Family History: The patient's family history includes Aortic aneurysm in his mother; Cancer in his maternal grandfather; Heart disease in his father.  ROS:   Please see the history of present illness.    All other systems reviewed and are negative.  EKGs/Labs/Other Studies Reviewed:    The following studies were reviewed today: EFT HEART CATH AND CORONARY ANGIOGRAPHY    Conclusion    Ost Cx to Prox Cx lesion is 100% stenosed.  Ost LAD to Prox LAD lesion is 40% stenosed.  Mid LAD lesion is 50% stenosed.  Ramus lesion is 70% stenosed.  The left ventricular systolic function is normal.  LV end diastolic pressure is normal.  The left ventricular ejection fraction is 55-65% by visual estimate.   1. Single vessel occlusive CAD. The LCx is occluded. This appears to be a relatively small vessel with a large ramus branch supplying the lateral wall.  2. Normal LV function 3. Normal LVEDP  Plan: aggressive medical therapy and risk factor modification. DAPT for one year for ACS.     Recent Labs: No results found  for requested labs within last 8760 hours.  Recent Lipid Panel    Component Value Date/Time   CHOL 112 03/05/2018 1009   TRIG 141 03/05/2018 1009   HDL 30 (L) 03/05/2018 1009   CHOLHDL 3.7 03/05/2018 1009   CHOLHDL 6.7 12/11/2017 0358   VLDL 22 12/11/2017 0358   LDLCALC 54 03/05/2018 1009    Physical Exam:    VS:  BP (!) 160/100 (BP Location: Right Arm, Patient Position: Sitting, Cuff Size: Large)   Pulse 89   Ht 6\' 2"  (1.88 m)   Wt 264 lb (119.7 kg)   SpO2 96%   BMI 33.90 kg/m     Wt Readings from Last 3 Encounters:  01/14/20 264 lb (119.7 kg)  03/05/18 256 lb (116.1 kg)  12/21/17 252 lb (114.3 kg)     GEN: Patient is in no acute distress HEENT: Normal NECK: No JVD; No carotid bruits LYMPHATICS: No lymphadenopathy CARDIAC: Hear sounds regular, 2/6 systolic murmur at the apex. RESPIRATORY:  Clear to auscultation without rales, wheezing or rhonchi  ABDOMEN: Soft, non-tender, non-distended MUSCULOSKELETAL:  No edema; No deformity  SKIN: Warm and dry NEUROLOGIC:  Alert and oriented x 3 PSYCHIATRIC:  Normal affect   Signed, Garwin Brothers, MD  01/14/2020 10:13 AM    [facility]

## 2020-02-17 ENCOUNTER — Encounter (HOSPITAL_COMMUNITY): Payer: Self-pay

## 2020-02-17 ENCOUNTER — Inpatient Hospital Stay (HOSPITAL_COMMUNITY)
Admission: EM | Admit: 2020-02-17 | Discharge: 2020-02-20 | DRG: 246 | Disposition: A | Discharge status: Home / Self Care | Payer: BLUE CROSS/BLUE SHIELD | Attending: Internal Medicine | Admitting: Internal Medicine

## 2020-02-17 ENCOUNTER — Emergency Department (HOSPITAL_COMMUNITY): Payer: BLUE CROSS/BLUE SHIELD

## 2020-02-17 ENCOUNTER — Telehealth: Payer: Self-pay | Admitting: Cardiology

## 2020-02-17 DIAGNOSIS — I25119 Atherosclerotic heart disease of native coronary artery with unspecified angina pectoris: Secondary | ICD-10-CM | POA: Diagnosis present

## 2020-02-17 DIAGNOSIS — Z6833 Body mass index (BMI) 33.0-33.9, adult: Secondary | ICD-10-CM

## 2020-02-17 DIAGNOSIS — U071 COVID-19: Secondary | ICD-10-CM | POA: Diagnosis present

## 2020-02-17 DIAGNOSIS — I209 Angina pectoris, unspecified: Secondary | ICD-10-CM | POA: Diagnosis present

## 2020-02-17 DIAGNOSIS — Z7902 Long term (current) use of antithrombotics/antiplatelets: Secondary | ICD-10-CM

## 2020-02-17 DIAGNOSIS — R072 Precordial pain: Secondary | ICD-10-CM

## 2020-02-17 DIAGNOSIS — E1165 Type 2 diabetes mellitus with hyperglycemia: Secondary | ICD-10-CM

## 2020-02-17 DIAGNOSIS — I2584 Coronary atherosclerosis due to calcified coronary lesion: Secondary | ICD-10-CM | POA: Diagnosis present

## 2020-02-17 DIAGNOSIS — I110 Hypertensive heart disease with heart failure: Secondary | ICD-10-CM | POA: Diagnosis present

## 2020-02-17 DIAGNOSIS — E669 Obesity, unspecified: Secondary | ICD-10-CM

## 2020-02-17 DIAGNOSIS — Z955 Presence of coronary angioplasty implant and graft: Secondary | ICD-10-CM

## 2020-02-17 DIAGNOSIS — I2510 Atherosclerotic heart disease of native coronary artery without angina pectoris: Secondary | ICD-10-CM

## 2020-02-17 DIAGNOSIS — I214 Non-ST elevation (NSTEMI) myocardial infarction: Secondary | ICD-10-CM | POA: Diagnosis not present

## 2020-02-17 DIAGNOSIS — I252 Old myocardial infarction: Secondary | ICD-10-CM

## 2020-02-17 DIAGNOSIS — Z7982 Long term (current) use of aspirin: Secondary | ICD-10-CM

## 2020-02-17 DIAGNOSIS — I5041 Acute combined systolic (congestive) and diastolic (congestive) heart failure: Secondary | ICD-10-CM | POA: Diagnosis present

## 2020-02-17 DIAGNOSIS — Z79899 Other long term (current) drug therapy: Secondary | ICD-10-CM

## 2020-02-17 DIAGNOSIS — Z20822 Contact with and (suspected) exposure to covid-19: Secondary | ICD-10-CM | POA: Diagnosis present

## 2020-02-17 DIAGNOSIS — Z8249 Family history of ischemic heart disease and other diseases of the circulatory system: Secondary | ICD-10-CM

## 2020-02-17 DIAGNOSIS — E6609 Other obesity due to excess calories: Secondary | ICD-10-CM | POA: Diagnosis present

## 2020-02-17 DIAGNOSIS — I10 Essential (primary) hypertension: Secondary | ICD-10-CM | POA: Diagnosis present

## 2020-02-17 DIAGNOSIS — E782 Mixed hyperlipidemia: Secondary | ICD-10-CM

## 2020-02-17 LAB — CBC
HCT: 42 % (ref 39.0–52.0)
Hemoglobin: 15.1 g/dL (ref 13.0–17.0)
MCH: 31.2 pg (ref 26.0–34.0)
MCHC: 36 g/dL (ref 30.0–36.0)
MCV: 86.8 fL (ref 80.0–100.0)
Platelets: 243 10*3/uL (ref 150–400)
RBC: 4.84 MIL/uL (ref 4.22–5.81)
RDW: 12.2 % (ref 11.5–15.5)
WBC: 9.4 10*3/uL (ref 4.0–10.5)
nRBC: 0 % (ref 0.0–0.2)

## 2020-02-17 LAB — BASIC METABOLIC PANEL
Anion gap: 9 (ref 5–15)
BUN: 17 mg/dL (ref 8–23)
CO2: 23 mmol/L (ref 22–32)
Calcium: 9.3 mg/dL (ref 8.9–10.3)
Chloride: 101 mmol/L (ref 98–111)
Creatinine, Ser: 0.8 mg/dL (ref 0.61–1.24)
GFR, Estimated: >60 mL/min (ref >60)
Glucose, Bld: 297 mg/dL — ABNORMAL HIGH (ref 70–99)
Potassium: 3.8 mmol/L (ref 3.5–5.1)
Sodium: 133 mmol/L — ABNORMAL LOW (ref 135–145)

## 2020-02-17 LAB — TROPONIN I (HIGH SENSITIVITY): Troponin I (High Sensitivity): 796 ng/L (ref <18)

## 2020-02-17 NOTE — Telephone Encounter (Signed)
Pt c/o medication issue:  1. Name of Medication: isosorbide mononitrate (IMDUR) 30 MG 24 hr tablet  2. How are you currently taking this medication (dosage and times per day)? As prescribed   3. Are you having a reaction (difficulty breathing--STAT)? No   4. What is your medication issue? 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 Due to this he is needing an alternative medication prescribed before he has to leave. He states he is still currently taking it at this time and there is a listed of approved medications he can be on, but he is unsure which is an alternative for this. Please advise.

## 2020-02-17 NOTE — Addendum Note (Signed)
Addended by: Eleonore Chiquito on: 02/17/2020 04:34 PM   Modules accepted: Orders

## 2020-02-17 NOTE — ED Triage Notes (Signed)
Pt c/o CP off & on "for a couple hours" tonight, feels pain radiating into bilateral arms, states he noticed brief "tightness" in neck. Describes pain as sharp 9/10 then moves to dull when at rest, able to get pain to 0 when resting long enough.  Endorses hx of NSTEMI, HTN, CAD

## 2020-02-17 NOTE — Telephone Encounter (Signed)
Please asked the patient if he is walking every day and I would like to know how much she is walking every day and what he is having any symptoms.  If he has no symptoms he can stop this medication and I want him to walk half an hour daily and let me know how he feels.

## 2020-02-17 NOTE — ED Provider Notes (Signed)
[facility]   CSN: [id number] Arrival date & time: 02/17/20  2130     History Chief Complaint  Patient presents with  . Chest Pain    Aaron BlazeWilliam E Baria is a 70 y.o. male.  Aaron Summers is a 70 y.o. male NSTEMI, hypertension, hyperlipidemia, CAD, who presents to the emergency department for evaluation of chest pain.  Patient reports for the past 10 days he has been having intermittent episodes of chest pain.  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  On Friday he had an episode that would not relieve with rest and he had to take 3 doses of nitro before pain resolved.  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  Tonight was going to have dinner with his daughter and started having chest pain with activity once again had to take 3 doses of nitro before dinner, and as he was getting home from dinner pain returned again and he took 2 additional doses of nitro.  He describes pain as a pressure across the center of his chest that radiates down both of his arms and to his jaw.  Reports when pain is severe he does feel a bit short of breath and is also had some shortness of breath when trying to lay flat at night to sleep.  Denies any nausea, vomiting or diaphoresis.  No lightheadedness or syncope.  No abdominal pain.  No lower extremity swelling.  Followed by Dr. Tomie Chinaevankar with cardiology.        Past Medical History:  Diagnosis Date  . CAD    a. LHC 12/11/2017: Ost Cx to Prox Cx 100% stenosed with large ramus branch supplying lateral wall, Ost LAD to Prox LAD 40% stenosed, mid LAD 50% stenosed, Ramus 70% stenosed  . CAD (coronary artery disease) 12/12/2017  . Essential hypertension 03/05/2018  . HTN (hypertension) 12/10/2017  .  Hyperlipemia 12/10/2017  . Hyperlipidemia   . Hypertension   . Mixed dyslipidemia 03/05/2018  . NSTEMI (non-ST elevated myocardial infarction) (HCC) 12/10/2017    Patient Active Problem List   Diagnosis Date Noted  . Hyperlipidemia   . CAD   . Hypertension   . Essential hypertension 03/05/2018  . Mixed dyslipidemia 03/05/2018  . CAD (coronary artery disease) 12/12/2017  . HTN (hypertension) 12/10/2017  . Hyperlipemia 12/10/2017  . NSTEMI (non-ST elevated myocardial infarction) (HCC) 12/10/2017    Past Surgical History:  Procedure Laterality Date  . FOREIGN BODY REMOVAL Left 05/16/2012   Procedure: FOREIGN BODY REMOVAL LEFT THUMB;  Surgeon: Tami RibasKevin R Kuzma, MD;  Location: [facility];  Service: Orthopedics;  Laterality: Left;  . FRACTURE SURGERY    . INGUINAL HERNIA REPAIR Left 1954  . LEFT HEART CATH AND CORONARY ANGIOGRAPHY N/A 12/11/2017   Procedure: LEFT HEART CATH AND CORONARY ANGIOGRAPHY;  Surgeon: SwazilandJordan, Peter M, MD;  [facility];  Service: Cardiovascular;  Laterality: N/A;  . ORIF CLAVICULAR FRACTURE Right 1971       Family History  Problem Relation Age of Onset  . Aortic aneurysm Mother   . Heart disease Father   . Cancer Maternal Grandfather     Social History   Tobacco Use  . Smoking status: Never Smoker  . Smokeless tobacco: Never  Used  Vaping Use  . Vaping Use: Never used  Substance Use Topics  . Alcohol use: Never  . Drug use: No    Home Medications Prior to Admission medications   Medication Sig Start Date End Date Taking? Authorizing Provider  aspirin EC 81 MG tablet Take 81 mg by mouth daily.    [provider]  clopidogrel (PLAVIX) 75 MG tablet Take 1 tablet (75 mg total) by mouth daily. 07/23/19   Revankar, Aundra Dubin, MD  isosorbide mononitrate (IMDUR) 30 MG 24 hr tablet Take 1 tablet (30 mg total) by mouth daily. Patient needs appointment prior to any further refills. (2nd Attempt). 09/19/19   Revankar, Aundra Dubin, MD  metoprolol tartrate (LOPRESSOR) 50 MG tablet TAKE 1 TABLET(50 MG) BY MOUTH TWICE DAILY 01/14/20   Revankar, Aundra Dubin, MD  nitroGLYCERIN (NITROSTAT) 0.4 MG SL tablet Place 1 tablet (0.4 mg total) under the tongue every 5 (five) minutes x 3 doses as needed for chest pain. 01/14/20   Revankar, Aundra Dubin, MD    Allergies    Patient has no known allergies.  Review of Systems   Review of Systems  Constitutional: Negative for chills and fever.  HENT: Negative.   Eyes: Negative for visual disturbance.  Respiratory: Positive for shortness of breath. Negative for cough.   Cardiovascular: Positive for chest pain. Negative for palpitations and leg swelling.  Gastrointestinal: Negative for abdominal pain, nausea and vomiting.  Genitourinary: Negative for dysuria and frequency.  Musculoskeletal: Negative for arthralgias and myalgias.  Skin: Negative for color change and rash.  Neurological: Negative for dizziness, syncope and light-headedness.  All other systems reviewed and are negative.   Physical Exam Updated Vital Signs BP (!) 166/103 (BP Location: Right Arm)   Pulse (!) 113   Temp 98.7 F (37.1 C) (Oral)   Resp 16   SpO2 97%   Physical Exam Vitals and nursing note reviewed.  Constitutional:      General: He is not in acute distress.    Appearance: He is well-developed and well-nourished. He is obese. He is not ill-appearing or diaphoretic.     Comments: Well-appearing and in no distress  HENT:     Head: Normocephalic and atraumatic.     Mouth/Throat:     Mouth: Oropharynx is clear and moist.  Eyes:     General:        Right eye: No discharge.        Left eye: No discharge.     Extraocular Movements: EOM normal.     Pupils: Pupils are equal, round, and reactive to light.  Cardiovascular:     Rate and Rhythm: Normal rate and regular rhythm.     Pulses: Intact distal pulses.          Radial pulses are 2+ on the right side and 2+ on the left side.       Dorsalis pedis pulses  are 2+ on the right side and 2+ on the left side.     Heart sounds: Normal heart sounds. No murmur heard. No friction rub. No gallop.   Pulmonary:     Effort: Pulmonary effort is normal. No respiratory distress.     Breath sounds: Normal breath sounds. No wheezing or rales.     Comments: Respirations equal and unlabored, patient able to speak in full sentences, lungs clear to auscultation bilaterally  Chest:     Chest wall: No tenderness.  Abdominal:     General: Bowel sounds are normal.  There is no distension.     Palpations: Abdomen is soft. There is no mass.     Tenderness: There is no abdominal tenderness. There is no guarding.     Comments: Abdomen soft, nondistended, nontender to palpation in all quadrants without guarding or peritoneal signs   Musculoskeletal:        General: No deformity or edema.     Cervical back: Neck supple.     Right lower leg: No tenderness. No edema.     Left lower leg: No tenderness. No edema.  Skin:    General: Skin is warm and dry.     Capillary Refill: Capillary refill takes less than 2 seconds.  Neurological:     Mental Status: He is alert.     Coordination: Coordination normal.     Comments: Speech is clear, able to follow commands Moves extremities without ataxia, coordination intact  Psychiatric:        Mood and Affect: Mood normal.        Behavior: Behavior normal.     ED Results / Procedures / Treatments   Labs (all labs ordered are listed, but only abnormal results are displayed) Labs Reviewed  BASIC METABOLIC PANEL - Abnormal; Notable for the following components:      Result Value   Sodium 133 (*)    Glucose, Bld 297 (*)    All other components within normal limits  TROPONIN I (HIGH SENSITIVITY) - Abnormal; Notable for the following components:   Troponin I (High Sensitivity) 796 (*)    All other components within normal limits  CBC  TROPONIN I (HIGH SENSITIVITY)    EKG EKG Interpretation  Date/Time:  Tuesday February 17 2020 21:51:05 EST Ventricular Rate:  108 PR Interval:  186 QRS Duration: 120 QT Interval:  336 QTC Calculation: 450 R Axis:   -25 Text Interpretation: Sinus tachycardia Left ventricular hypertrophy with QRS widening and repolarization abnormality ( R in aVL , Cornell product ) Cannot rule out Septal infarct , age undetermined Abnormal ECG T wave inversion Nonspecific ST elevation Confirmed by Ross Marcus ([id number]) on 02/17/2020 11:40:07 PM   Radiology DG Chest 2 View  Result Date: 02/17/2020 CLINICAL DATA:  Chest pain radiating into both arms. EXAM: CHEST - 2 VIEW COMPARISON:  Chest radiograph and 12/10/2017 FINDINGS: The cardiomediastinal contours are normal. Atherosclerosis of the thoracic aorta. Pulmonary vasculature is normal. No consolidation, pleural effusion, or pneumothorax. No acute osseous abnormalities are seen. IMPRESSION: No acute chest findings. Aortic Atherosclerosis (ICD10-I70.0). Electronically Signed   By: Narda Rutherford M.D.   On: 02/17/2020 22:33    Procedures .Critical Care Performed by: Dartha Lodge, PA-C Authorized by: Dartha Lodge, PA-C   Critical care provider statement:    Critical care time (minutes):  45   Critical care was necessary to treat or prevent imminent or life-threatening deterioration of the following conditions:  Cardiac failure (NSTEMI)   Critical care was time spent personally by me on the following activities:  Discussions with consultants, evaluation of patient's response to treatment, examination of patient, ordering and performing treatments and interventions, ordering and review of laboratory studies, ordering and review of radiographic studies, pulse oximetry, re-evaluation of patient's condition, obtaining history from patient or surrogate and review of old charts   Care discussed with: admitting provider       Medications Ordered in ED Medications - No data to display  ED Course  I have reviewed the triage vital signs and the  nursing notes.  Pertinent labs & imaging results that were available during my care of the patient were reviewed by me and considered in my medical decision making (see chart for details).    MDM Rules/Calculators/A&P                         Patient presents to the emergency department with chest pain. Patient nontoxic appearing, in no apparent distress, vitals without significant abnormality and patient is currently chest pain-free. Fairly benign physical exam.  Despite reassuring exam patient with very concerning story for ACS or unstable angina.  Prior heart catheterization reviewed: 12/19, single-vessel occlusive CAD in left circumflex Patient's primary cardiologist: Dr. Tomie China  DDX: ACS, pulmonary embolism, dissection, pneumothorax, pneumonia, arrhythmia, severe anemia, MSK, GERD, anxiety. Evaluation initiated with labs, EKG, and CXR. Patient on cardiac monitor.   CBC: No leukocytosis, normal hemoglobin BMP: Glucose 220, no other significant electrolyte derangements, normal renal function Troponin: Initial troponin elevated at 796, concerning for NSTEMI in the setting of patient's chest pain story. EKG: Normal sinus rhythm with ST and T wave changes when compared to prior, no STEMI CXR: Negative, without infiltrate, effusion, pneumothorax, or fracture/dislocation.   Patient with concerning chest pain story with new EKG changes and elevated troponin at 796.  Patient with NSTEMI, he is currently chest pain-free, will give aspirin and start patient on heparin drip, will consult cardiology.  Case discussed with Dr. Florina Ou with cardiology who will see and admit the patient.   Final Clinical Impression(s) / ED Diagnoses Final diagnoses:  NSTEMI (non-ST elevated myocardial infarction) [facility])    Rx / DC Orders ED Discharge Orders    None       Dartha Lodge, [place] 02/18/20 0735    Shon Baton, MD 02/19/20 [phone]

## 2020-02-17 NOTE — ED Provider Notes (Incomplete)
[facility] EMERGENCY DEPARTMENT Provider Note   CSN: [id number] Arrival date & time: 02/17/20  2130     History Chief Complaint  Patient presents with  . Chest Pain    Aaron Summers is a 70 y.o. male.  HPI     Past Medical History:  Diagnosis Date  . CAD    a. LHC 12/11/2017: Ost Cx to Prox Cx 100% stenosed with large ramus branch supplying lateral wall, Ost LAD to Prox LAD 40% stenosed, mid LAD 50% stenosed, Ramus 70% stenosed  . CAD (coronary artery disease) 12/12/2017  . Essential hypertension 03/05/2018  . HTN (hypertension) 12/10/2017  . Hyperlipemia 12/10/2017  . Hyperlipidemia   . Hypertension   . Mixed dyslipidemia 03/05/2018  . NSTEMI (non-ST elevated myocardial infarction) (HCC) 12/10/2017    Patient Active Problem List   Diagnosis Date Noted  . Hyperlipidemia   . CAD   . Hypertension   . Essential hypertension 03/05/2018  . Mixed dyslipidemia 03/05/2018  . CAD (coronary artery disease) 12/12/2017  . HTN (hypertension) 12/10/2017  . Hyperlipemia 12/10/2017  . NSTEMI (non-ST elevated myocardial infarction) (HCC) 12/10/2017    Past Surgical History:  Procedure Laterality Date  . FOREIGN BODY REMOVAL Left 05/16/2012   Procedure: FOREIGN BODY REMOVAL LEFT THUMB;  Surgeon: Tami Ribas, MD;  Location:  SUR[facility]ERY CENTER;  Service: Orthopedics;  Laterality: Left;  . FRACTURE SURGERY    . INGUINAL HERNIA REPAIR Left 1954  . LEFT HEART CATH AND CORONARY ANGIOGRAPHY N/A 12/11/2017   Procedure: LEFT HEART CATH AND CORONARY ANGIOGRAPHY;  Surgeon: Swaziland, Peter M, MD;  [facility]: [facility] INVASIVE CV LAB;  Service: Cardiovascular;  Laterality: N/A;  . ORIF CLAVICULAR FRACTURE Right 1971       Family History  Problem Relation Age of Onset  . Aortic aneurysm Mother   . Heart disease Father   . Cancer Maternal Grandfather     Social History   Tobacco Use  . Smoking status: Never Smoker  . Smokeless tobacco: Never Used  Vaping Use  .  Vaping Use: Never used  Substance Use Topics  . Alcohol use: Never  . Drug use: No    Home Medications Prior to Admission medications   Medication Sig Start Date End Date Taking? Authorizing Provider  aspirin EC 81 MG tablet Take 81 mg by mouth daily.    [provider]  clopidogrel (PLAVIX) 75 MG tablet Take 1 tablet (75 mg total) by mouth daily. 07/23/19   Revankar, Aundra Dubin, MD  isosorbide mononitrate (IMDUR) 30 MG 24 hr tablet Take 1 tablet (30 mg total) by mouth daily. Patient needs appointment prior to any further refills. (2nd Attempt). 09/19/19   Revankar, Aundra Dubin, MD  metoprolol tartrate (LOPRESSOR) 50 MG tablet TAKE 1 TABLET(50 MG) BY MOUTH TWICE DAILY 01/14/20   Revankar, Aundra Dubin, MD  nitroGLYCERIN (NITROSTAT) 0.4 MG SL tablet Place 1 tablet (0.4 mg total) under the tongue every 5 (five) minutes x 3 doses as needed for chest pain. 01/14/20   Revankar, Aundra Dubin, MD    Allergies    Patient has no known allergies.  Review of Systems   Review of Systems  Physical Exam Updated Vital Signs BP (!) 166/103 (BP Location: Right Arm)   Pulse (!) 113   Temp 98.7 F (37.1 C) (Oral)   Resp 16   SpO2 97%   Physical Exam  ED Results / Procedures / Treatments   Labs (all labs ordered are listed, but  only abnormal results are displayed) Labs Reviewed  BASIC METABOLIC PANEL - Abnormal; Notable for the following components:      Result Value   Sodium 133 (*)    Glucose, Bld 297 (*)    All other components within normal limits  TROPONIN I (HIGH SENSITIVITY) - Abnormal; Notable for the following components:   Troponin I (High Sensitivity) 796 (*)    All other components within normal limits  CBC  TROPONIN I (HIGH SENSITIVITY)    EKG EKG Interpretation  Date/Time:  Tuesday February 17 2020 21:51:05 EST Ventricular Rate:  108 PR Interval:  186 QRS Duration: 120 QT Interval:  336 QTC Calculation: 450 R Axis:   -25 Text Interpretation: Sinus tachycardia Left ventricular  hypertrophy with QRS widening and repolarization abnormality ( R in aVL , Cornell product ) Cannot rule out Septal infarct , age undetermined Abnormal ECG T wave inversion Nonspecific ST elevation Confirmed by Ross Marcus ([id number]) on 02/17/2020 11:40:07 PM   Radiology DG Chest 2 View  Result Date: 02/17/2020 CLINICAL DATA:  Chest pain radiating into both arms. EXAM: CHEST - 2 VIEW COMPARISON:  Chest radiograph and 12/10/2017 FINDINGS: The cardiomediastinal contours are normal. Atherosclerosis of the thoracic aorta. Pulmonary vasculature is normal. No consolidation, pleural effusion, or pneumothorax. No acute osseous abnormalities are seen. IMPRESSION: No acute chest findings. Aortic Atherosclerosis (ICD10-I70.0). Electronically Signed   By: Narda Rutherford M.D.   On: 02/17/2020 22:33    Procedures Procedures {Remember to document critical care time when appropriate:1}  Medications Ordered in ED Medications - No data to display  ED Course  I have reviewed the triage vital signs and the nursing notes.  Pertinent labs & imaging results that were available during my care of the patient were reviewed by me and considered in my medical decision making (see chart for details).    MDM Rules/Calculators/A&P                          *** Final Clinical Impression(s) / ED Diagnoses Final diagnoses:  None    Rx / DC Orders ED Discharge Orders    None

## 2020-02-18 ENCOUNTER — Observation Stay (HOSPITAL_BASED_OUTPATIENT_CLINIC_OR_DEPARTMENT_OTHER): Payer: BLUE CROSS/BLUE SHIELD

## 2020-02-18 ENCOUNTER — Other Ambulatory Visit: Payer: Self-pay

## 2020-02-18 ENCOUNTER — Encounter (HOSPITAL_COMMUNITY): Payer: Self-pay | Admitting: Cardiology

## 2020-02-18 DIAGNOSIS — E6609 Other obesity due to excess calories: Secondary | ICD-10-CM | POA: Diagnosis present

## 2020-02-18 DIAGNOSIS — R079 Chest pain, unspecified: Secondary | ICD-10-CM

## 2020-02-18 DIAGNOSIS — Z20822 Contact with and (suspected) exposure to covid-19: Secondary | ICD-10-CM | POA: Diagnosis present

## 2020-02-18 DIAGNOSIS — I214 Non-ST elevation (NSTEMI) myocardial infarction: Principal | ICD-10-CM

## 2020-02-18 DIAGNOSIS — I2510 Atherosclerotic heart disease of native coronary artery without angina pectoris: Secondary | ICD-10-CM | POA: Diagnosis not present

## 2020-02-18 DIAGNOSIS — E785 Hyperlipidemia, unspecified: Secondary | ICD-10-CM

## 2020-02-18 DIAGNOSIS — E1165 Type 2 diabetes mellitus with hyperglycemia: Secondary | ICD-10-CM | POA: Diagnosis present

## 2020-02-18 DIAGNOSIS — I10 Essential (primary) hypertension: Secondary | ICD-10-CM | POA: Diagnosis not present

## 2020-02-18 DIAGNOSIS — Z8249 Family history of ischemic heart disease and other diseases of the circulatory system: Secondary | ICD-10-CM | POA: Diagnosis not present

## 2020-02-18 DIAGNOSIS — Z79899 Other long term (current) drug therapy: Secondary | ICD-10-CM | POA: Diagnosis not present

## 2020-02-18 DIAGNOSIS — I252 Old myocardial infarction: Secondary | ICD-10-CM | POA: Diagnosis not present

## 2020-02-18 DIAGNOSIS — E782 Mixed hyperlipidemia: Secondary | ICD-10-CM | POA: Diagnosis present

## 2020-02-18 DIAGNOSIS — E669 Obesity, unspecified: Secondary | ICD-10-CM

## 2020-02-18 DIAGNOSIS — I2584 Coronary atherosclerosis due to calcified coronary lesion: Secondary | ICD-10-CM | POA: Diagnosis present

## 2020-02-18 DIAGNOSIS — Z7982 Long term (current) use of aspirin: Secondary | ICD-10-CM | POA: Diagnosis not present

## 2020-02-18 DIAGNOSIS — I5041 Acute combined systolic (congestive) and diastolic (congestive) heart failure: Secondary | ICD-10-CM | POA: Diagnosis present

## 2020-02-18 DIAGNOSIS — U071 COVID-19: Secondary | ICD-10-CM

## 2020-02-18 DIAGNOSIS — Z955 Presence of coronary angioplasty implant and graft: Secondary | ICD-10-CM | POA: Diagnosis not present

## 2020-02-18 DIAGNOSIS — I110 Hypertensive heart disease with heart failure: Secondary | ICD-10-CM | POA: Diagnosis present

## 2020-02-18 DIAGNOSIS — Z6833 Body mass index (BMI) 33.0-33.9, adult: Secondary | ICD-10-CM | POA: Diagnosis not present

## 2020-02-18 DIAGNOSIS — I25119 Atherosclerotic heart disease of native coronary artery with unspecified angina pectoris: Secondary | ICD-10-CM | POA: Diagnosis present

## 2020-02-18 DIAGNOSIS — Z7902 Long term (current) use of antithrombotics/antiplatelets: Secondary | ICD-10-CM | POA: Diagnosis not present

## 2020-02-18 HISTORY — DX: Obesity, unspecified: E66.9

## 2020-02-18 LAB — RESP PANEL BY RT-PCR (FLU A&B, COVID) ARPGX2
Influenza A by PCR: NEGATIVE
Influenza B by PCR: NEGATIVE
SARS Coronavirus 2 by RT PCR: POSITIVE — AB

## 2020-02-18 LAB — D-DIMER, QUANTITATIVE: D-Dimer, Quant: <0.27 ug/mL-FEU (ref 0.00–0.50)

## 2020-02-18 LAB — TROPONIN I (HIGH SENSITIVITY)
Troponin I (High Sensitivity): 861 ng/L (ref <18)
Troponin I (High Sensitivity): 1203 ng/L (ref <18)
Troponin I (High Sensitivity): 1147 ng/L (ref <18)

## 2020-02-18 LAB — ECHOCARDIOGRAM COMPLETE
Area-P 1/2: 3.15 cm2
Calc EF: 38.4 %
Height: 74 in
Single Plane A2C EF: 40.6 %
Single Plane A4C EF: 34.2 %
Weight: 4182.4 oz

## 2020-02-18 LAB — CBC
HCT: 41.9 % (ref 39.0–52.0)
Hemoglobin: 14.5 g/dL (ref 13.0–17.0)
MCH: 30.7 pg (ref 26.0–34.0)
MCHC: 34.6 g/dL (ref 30.0–36.0)
MCV: 88.8 fL (ref 80.0–100.0)
Platelets: 227 10*3/uL (ref 150–400)
RBC: 4.72 MIL/uL (ref 4.22–5.81)
RDW: 12.1 % (ref 11.5–15.5)
WBC: 9.7 10*3/uL (ref 4.0–10.5)
nRBC: 0 % (ref 0.0–0.2)

## 2020-02-18 LAB — TSH: TSH: 4.92 u[IU]/mL — ABNORMAL HIGH (ref 0.350–4.500)

## 2020-02-18 LAB — T4, FREE: Free T4: 0.92 ng/dL (ref 0.61–1.12)

## 2020-02-18 LAB — LIPID PANEL
Cholesterol: 108 mg/dL (ref 0–200)
HDL: 25 mg/dL — ABNORMAL LOW (ref >40)
LDL Cholesterol: 46 mg/dL (ref 0–99)
Total CHOL/HDL Ratio: 4.3 RATIO
Triglycerides: 187 mg/dL — ABNORMAL HIGH (ref <150)
VLDL: 37 mg/dL (ref 0–40)

## 2020-02-18 LAB — GLUCOSE, CAPILLARY
Glucose-Capillary: 330 mg/dL — ABNORMAL HIGH (ref 70–99)
Glucose-Capillary: 294 mg/dL — ABNORMAL HIGH (ref 70–99)

## 2020-02-18 LAB — BASIC METABOLIC PANEL
Anion gap: 12 (ref 5–15)
BUN: 16 mg/dL (ref 8–23)
CO2: 21 mmol/L — ABNORMAL LOW (ref 22–32)
Calcium: 9.1 mg/dL (ref 8.9–10.3)
Chloride: 102 mmol/L (ref 98–111)
Creatinine, Ser: 0.71 mg/dL (ref 0.61–1.24)
GFR, Estimated: >60 mL/min (ref >60)
Glucose, Bld: 220 mg/dL — ABNORMAL HIGH (ref 70–99)
Potassium: 3.9 mmol/L (ref 3.5–5.1)
Sodium: 135 mmol/L (ref 135–145)

## 2020-02-18 LAB — HIV ANTIBODY (ROUTINE TESTING W REFLEX): HIV Screen 4th Generation wRfx: NONREACTIVE

## 2020-02-18 LAB — LACTATE DEHYDROGENASE: LDH: 173 U/L (ref 98–192)

## 2020-02-18 LAB — HEPARIN LEVEL (UNFRACTIONATED)
Heparin Unfractionated: 0.18 IU/mL — ABNORMAL LOW (ref 0.30–0.70)
Heparin Unfractionated: 0.17 IU/mL — ABNORMAL LOW (ref 0.30–0.70)

## 2020-02-18 LAB — FERRITIN: Ferritin: 279 ng/mL (ref 24–336)

## 2020-02-18 LAB — FIBRINOGEN: Fibrinogen: 380 mg/dL (ref 210–475)

## 2020-02-18 LAB — PROTIME-INR
INR: 1.1 (ref 0.8–1.2)
Prothrombin Time: 14.2 seconds (ref 11.4–15.2)

## 2020-02-18 LAB — C-REACTIVE PROTEIN: CRP: 0.6 mg/dL (ref <1.0)

## 2020-02-18 LAB — PROCALCITONIN: Procalcitonin: <0.1 ng/mL

## 2020-02-18 MED ORDER — ASPIRIN 81 MG PO CHEW: 324.0000 mg | CHEWABLE_TABLET | ORAL | Status: AC

## 2020-02-18 MED ORDER — METOPROLOL TARTRATE 50 MG PO TABS
50.0000 mg | ORAL_TABLET | Freq: Two times a day (BID) | ORAL | Status: DC
  Administered 2020-02-18 – 2020-02-20 (×6): 50 mg via ORAL
  Filled 2020-02-18 (×2): qty 2
  Filled 2020-02-18 (×4): qty 1

## 2020-02-18 MED ORDER — INFLUENZA VAC A&B SA ADJ QUAD 0.5 ML IM PRSY
0.5000 mL | PREFILLED_SYRINGE | INTRAMUSCULAR | Status: DC
  Filled 2020-02-18: qty 0.5

## 2020-02-18 MED ORDER — ASPIRIN 300 MG RE SUPP: 300.0000 mg | RECTAL | Status: AC

## 2020-02-18 MED ORDER — ISOSORBIDE MONONITRATE ER 30 MG PO TB24
30.0000 mg | ORAL_TABLET | Freq: Every day | ORAL | Status: DC
  Administered 2020-02-18 – 2020-02-20 (×3): 30 mg via ORAL
  Filled 2020-02-18 (×3): qty 1

## 2020-02-18 MED ORDER — ONDANSETRON HCL 4 MG/2ML IJ SOLN: 4.0000 mg | Freq: Four times a day (QID) | INTRAMUSCULAR | Status: DC | PRN

## 2020-02-18 MED ORDER — ASPIRIN EC 81 MG PO TBEC
81.0000 mg | DELAYED_RELEASE_TABLET | Freq: Every day | ORAL | Status: DC
  Administered 2020-02-19 – 2020-02-20 (×2): 81 mg via ORAL
  Filled 2020-02-18 (×2): qty 1

## 2020-02-18 MED ORDER — CLOPIDOGREL BISULFATE 75 MG PO TABS
75.0000 mg | ORAL_TABLET | Freq: Every day | ORAL | Status: DC
  Administered 2020-02-18 – 2020-02-20 (×3): 75 mg via ORAL
  Filled 2020-02-18 (×3): qty 1

## 2020-02-18 MED ORDER — HEPARIN BOLUS VIA INFUSION
3000.0000 [IU] | Freq: Once | INTRAVENOUS | Status: AC
  Administered 2020-02-19: 3000 [IU] via INTRAVENOUS
  Filled 2020-02-18: qty 3000

## 2020-02-18 MED ORDER — SODIUM CHLORIDE 0.9 % IV SOLN: 250.0000 mL | INTRAVENOUS | Status: DC | PRN

## 2020-02-18 MED ORDER — SODIUM CHLORIDE 0.9% FLUSH: 3.0000 mL | INTRAVENOUS | Status: DC | PRN

## 2020-02-18 MED ORDER — ALBUTEROL SULFATE HFA 108 (90 BASE) MCG/ACT IN AERS
2.0000 | INHALATION_SPRAY | Freq: Four times a day (QID) | RESPIRATORY_TRACT | Status: DC
  Administered 2020-02-18 – 2020-02-19 (×3): 2 via RESPIRATORY_TRACT
  Filled 2020-02-18 (×3): qty 6.7

## 2020-02-18 MED ORDER — SODIUM CHLORIDE 0.9 % IV SOLN
100.0000 mg | Freq: Every day | INTRAVENOUS | Status: AC
  Administered 2020-02-19 – 2020-02-20 (×2): 100 mg via INTRAVENOUS
  Filled 2020-02-18 (×3): qty 20

## 2020-02-18 MED ORDER — HEPARIN (PORCINE) 25000 UT/250ML-% IV SOLN
1900.0000 [IU]/h | INTRAVENOUS | Status: DC
  Administered 2020-02-18: 1600 [IU]/h via INTRAVENOUS
  Administered 2020-02-18: 1400 [IU]/h via INTRAVENOUS
  Administered 2020-02-19: 1900 [IU]/h via INTRAVENOUS
  Filled 2020-02-18 (×4): qty 250

## 2020-02-18 MED ORDER — INSULIN ASPART 100 UNIT/ML ~~LOC~~ SOLN
0.0000 [IU] | Freq: Three times a day (TID) | SUBCUTANEOUS | Status: DC
  Administered 2020-02-18: 11 [IU] via SUBCUTANEOUS
  Administered 2020-02-19: 8 [IU] via SUBCUTANEOUS
  Administered 2020-02-19: 3 [IU] via SUBCUTANEOUS
  Administered 2020-02-20: 2 [IU] via SUBCUTANEOUS

## 2020-02-18 MED ORDER — ZOLPIDEM TARTRATE 5 MG PO TABS
5.0000 mg | ORAL_TABLET | Freq: Every evening | ORAL | Status: DC | PRN
Start: 2020-02-18 — End: 2020-02-20

## 2020-02-18 MED ORDER — SODIUM CHLORIDE 0.9 % IV SOLN
200.0000 mg | Freq: Once | INTRAVENOUS | Status: AC
  Administered 2020-02-18: 200 mg via INTRAVENOUS
  Filled 2020-02-18 (×3): qty 40

## 2020-02-18 MED ORDER — ASPIRIN 81 MG PO CHEW
324.0000 mg | CHEWABLE_TABLET | Freq: Once | ORAL | Status: AC
  Administered 2020-02-18: 324 mg via ORAL
  Filled 2020-02-18: qty 4

## 2020-02-18 MED ORDER — NITROGLYCERIN 0.4 MG SL SUBL: 0.4000 mg | SUBLINGUAL_TABLET | SUBLINGUAL | Status: DC | PRN

## 2020-02-18 MED ORDER — PERFLUTREN LIPID MICROSPHERE
1.0000 mL | INTRAVENOUS | Status: AC | PRN
  Administered 2020-02-18: 2 mL via INTRAVENOUS
  Filled 2020-02-18: qty 10

## 2020-02-18 MED ORDER — HEPARIN BOLUS VIA INFUSION
4000.0000 [IU] | Freq: Once | INTRAVENOUS | Status: AC
  Administered 2020-02-18: 4000 [IU] via INTRAVENOUS
  Filled 2020-02-18: qty 4000

## 2020-02-18 MED ORDER — SODIUM CHLORIDE 0.9 % IV SOLN: INTRAVENOUS | Status: DC

## 2020-02-18 MED ORDER — SODIUM CHLORIDE 0.9% FLUSH: 3.0000 mL | Freq: Two times a day (BID) | INTRAVENOUS | Status: DC

## 2020-02-18 MED ORDER — ATORVASTATIN CALCIUM 80 MG PO TABS
80.0000 mg | ORAL_TABLET | Freq: Every day | ORAL | Status: DC
  Administered 2020-02-18 – 2020-02-20 (×3): 80 mg via ORAL
  Filled 2020-02-18 (×3): qty 1

## 2020-02-18 MED ORDER — ACETAMINOPHEN 325 MG PO TABS: 650.0000 mg | ORAL_TABLET | ORAL | Status: DC | PRN

## 2020-02-18 MED ORDER — INSULIN ASPART 100 UNIT/ML ~~LOC~~ SOLN
0.0000 [IU] | Freq: Every day | SUBCUTANEOUS | Status: DC
  Administered 2020-02-18: 3 [IU] via SUBCUTANEOUS

## 2020-02-18 MED ORDER — ALPRAZOLAM 0.25 MG PO TABS: 0.2500 mg | ORAL_TABLET | Freq: Two times a day (BID) | ORAL | Status: DC | PRN

## 2020-02-18 NOTE — Progress Notes (Signed)
ANTICOAGULATION CONSULT NOTE - Initial Consult  Pharmacy Consult for Heparin  Indication: chest pain/ACS  No Known Allergies  Patient Measurements: Height: 6\' 2"  (188 cm) Weight: 118.6 kg (261 lb 6.4 oz) IBW/kg (Calculated) : 82.2  Vital Signs: Temp: 98.7 F (37.1 C) (02/15 2201) Temp Source: Oral (02/15 2201) BP: 123/81 (02/16 0130) Pulse Rate: 86 (02/16 0130)  Labs: Recent Labs    02/17/20 2219  HGB 15.1  HCT 42.0  PLT 243  CREATININE 0.80  TROPONINIHS 796*    Estimated Creatinine Clearance: 121 mL/min (by C-G formula based on SCr of 0.8 mg/dL).   Medical History: Past Medical History:  Diagnosis Date  . CAD    a. LHC 12/11/2017: Ost Cx to Prox Cx 100% stenosed with large ramus branch supplying lateral wall, Ost LAD to Prox LAD 40% stenosed, mid LAD 50% stenosed, Ramus 70% stenosed  . CAD (coronary artery disease) 12/12/2017  . Essential hypertension 03/05/2018  . HTN (hypertension) 12/10/2017  . Hyperlipemia 12/10/2017  . Hyperlipidemia   . Hypertension   . Mixed dyslipidemia 03/05/2018  . NSTEMI (non-ST elevated myocardial infarction) (HCC) 12/10/2017    Assessment: 70 y/o M with chest pain and elevated troponin. Starting heparin. CBC/renal function good. PTA meds reviewed.   Goal of Therapy:  Heparin level 0.3-0.7 units/ml Monitor platelets by anticoagulation protocol: Yes   Plan:  Heparin 4000 units BOLUS Start heparin drip at 1400 units/hr 0900 Heparin level Daily CBC/Heparin level Monitor for bleeding  74, PharmD, BCPS Clinical Pharmacist Phone: [phone]

## 2020-02-18 NOTE — H&P (Signed)
Cardiology Admission History and Physical:   Patient ID: Aaron Summers MRN: [id number]; DOB: May 09, 1950   Admission date: 02/17/2020  PCP:  Kirby Funk, MD   [facility]  Cardiologist:  Lesleigh Noe, MD  Advanced Practice Provider:  No care team member to display Electrophysiologist:  None       Chief Complaint:  CP/abn troponin  Patient Profile:   Aaron Summers is a 70 y.o. male with h/o CAD (see cath below from 2019) being medically managed, no prior PCI or other intervention, presents to ED w/ escalating anginal sx.  History of Present Illness:   [name] has h/o HTN, dyslipidemia and CAD with significant single-vessel LCx CAD that is being medically managed; he also has moderate disease in the LAD and ramus as well noted on most recent LHC in 2019. He has been having escalating anginal sx over the past week to 10 days. 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 It was a physically vigorous day and the pain did go away w/ NTG x 3 and rest.  He didn't have any discomfort for the rest of the week until tonight; he again required NTG x 3 for relief around dinner this evening. He describes his discomfort as a "pressure" discomfort that occurs in the center of his chest (midsternal area) when he exerts himself, lasts several minutes, and eventually goes away with rest. NTG SL also brings relief.  The chest discomfort is associated with radiation into both his arms as well as a feeling of tightness in his neck. No diaphoresis, nausea, palpitations, SOB or other associated sx. Pt states these sx are similar to prior anginal sx; he has h/o NSTEMI back in 2019 prior to his last LHC.  He is currently pain-free.  Past Medical History:  Diagnosis Date  . CAD    a. LHC 12/11/2017: Ost Cx to Prox Cx 100% stenosed with large ramus branch supplying  lateral wall, Ost LAD to Prox LAD 40% stenosed, mid LAD 50% stenosed, Ramus 70% stenosed  . CAD (coronary artery disease) 12/12/2017  . Essential hypertension 03/05/2018  . HTN (hypertension) 12/10/2017  . Hyperlipemia 12/10/2017  . Hyperlipidemia   . Hypertension   . Mixed dyslipidemia 03/05/2018  . NSTEMI (non-ST elevated myocardial infarction) (HCC) 12/10/2017    Past Surgical History:  Procedure Laterality Date  . FOREIGN BODY REMOVAL Left 05/16/2012   Procedure: FOREIGN BODY REMOVAL LEFT THUMB;  Surgeon: Tami Ribas, MD;  Location: [facility];  Service: Orthopedics;  Laterality: Left;  . FRACTURE SURGERY    . INGUINAL HERNIA REPAIR Left 1954  . LEFT HEART CATH AND CORONARY ANGIOGRAPHY N/A 12/11/2017   Procedure: LEFT HEART CATH AND CORONARY ANGIOGRAPHY;  Surgeon: Swaziland, Peter M, MD;  [facility]: [facility]VAS[facility] CV LAB;  Service: Cardiovascular;  Laterality: N/A;  . ORIF CLAVICULAR FRACTURE Right 1971     Medications Prior to Admission: Prior to Admission medications   Medication Sig Start Date End Date Taking? Authorizing Provider  aspirin EC 81 MG tablet Take 81 mg by mouth daily.    [provider]  clopidogrel (PLAVIX) 75 MG tablet Take 1 tablet (75 mg total) by mouth daily. 07/23/19   Revankar, Aundra Dubin, MD  isosorbide mononitrate (IMDUR) 30 MG 24 hr tablet Take 1 tablet (30 mg total) by mouth daily. Patient needs appointment prior to any further  refills. (2nd Attempt). 09/19/19   Revankar, Aundra Dubin, MD  metoprolol tartrate (LOPRESSOR) 50 MG tablet TAKE 1 TABLET(50 MG) BY MOUTH TWICE DAILY 01/14/20   Revankar, Aundra Dubin, MD  nitroGLYCERIN (NITROSTAT) 0.4 MG SL tablet Place 1 tablet (0.4 mg total) under the tongue every 5 (five) minutes x 3 doses as needed for chest pain. 01/14/20   Revankar, Aundra Dubin, MD     Allergies:   No Known Allergies  Social History:   Social History   Socioeconomic History  . Marital status: Married    Spouse name: Not on file  . Number of  children: Not on file  . Years of education: Not on file  . Highest education level: Not on file  Occupational History  . Not on file  Tobacco Use  . Smoking status: Never Smoker  . Smokeless tobacco: Never Used  Vaping Use  . Vaping Use: Never used  Substance and Sexual Activity  . Alcohol use: Never  . Drug use: No  . Sexual activity: Yes  Other Topics Concern  . Not on file  Social History Narrative  . Not on file   Social Determinants of Health   Financial Resource Strain: Not on file  Food Insecurity: Not on file  Transportation Needs: Not on file  Physical Activity: Not on file  Stress: Not on file  Social Connections: Not on file  Intimate Partner Violence: Not on file    Family History:   The patient's family history includes Aortic aneurysm in his mother; Cancer in his maternal grandfather; Heart disease in his father.    ROS:  Please see the history of present illness.  All other ROS reviewed and negative.     Physical Exam/Data:   Vitals:   02/17/20 2201 02/18/20 0015 02/18/20 0019  BP: (!) 166/103 139/82   Pulse: (!) 113 96   Resp: 16 (!) 21   Temp: 98.7 F (37.1 C)    TempSrc: Oral    SpO2: 97% 97%   Weight:   118.6 kg  Height:   6\' 2"  (1.88 m)   No intake or output data in the 24 hours ending 02/18/20 0107 Last 3 Weights 02/18/2020 01/14/2020 03/05/2018  Weight (lbs) 261 lb 6.4 oz 264 lb 256 lb  Weight (kg) 118.57 kg 119.75 kg 116.121 kg     Body mass index is 33.56 kg/m.  General:  Well nourished, well developed, in no acute distress HEENT: normal Lymph: no adenopathy Neck: no JVD Endocrine:  No thryomegaly Vascular: No carotid bruits; DP pulses 2+ bilaterally   Cardiac:  normal S1, S2; RRR; no murmur  Lungs:  clear to auscultation bilaterally, no wheezing, rhonchi or rales  Abd: soft, nontender, no hepatomegaly  Ext: no edema Musculoskeletal:  No deformities Skin: warm and dry  Neuro:  no focal abnormalities noted Psych:  Normal affect     EKG:  The ECG that was done 02-17-20 was personally reviewed and demonstrates NSR with nonspecific IVCD, LAD, LVH w/ associated repolarization changes (no change since prior tracing 01-14-20)  Relevant CV Studies: 12-11-17 LHC  Ost Cx to Prox Cx lesion is 100% stenosed.  Ost LAD to Prox LAD lesion is 40% stenosed.  Mid LAD lesion is 50% stenosed.  Ramus lesion is 70% stenosed.  The left ventricular systolic function is normal.  LV end diastolic pressure is normal.  The left ventricular ejection fraction is 55-65% by visual estimate.   1. Single vessel occlusive CAD. The LCx is occluded.  This appears to be a relatively small vessel with a large ramus branch supplying the lateral wall.  2. Normal LV function 3. Normal LVEDP  Plan: aggressive medical therapy and risk factor modification. DAPT for one year for ACS.    TTE 12-11-17 Left ventricle: The cavity size was normal. There was mild  concentric hypertrophy. Systolic function was normal. The  estimated ejection fraction was in the range of 55% to 60%.  Probable mild hypokinesis of the basalinferolateral myocardium;  consistent with ischemia or infarction in the distribution of the  left circumflex coronary artery. Doppler parameters are  consistent with abnormal left ventricular relaxation (grade 1  diastolic dysfunction).  - Left atrium: The atrium was mildly dilated.  -No hemodynamically significant valvular pathology  Laboratory Data:  High Sensitivity Troponin:   Recent Labs  Lab 02/17/20 2219  TROPONINIHS 796*      Chemistry Recent Labs  Lab 02/17/20 2219  NA 133*  K 3.8  CL 101  CO2 23  GLUCOSE 297*  BUN 17  CREATININE 0.80  CALCIUM 9.3  GFRNONAA >60  ANIONGAP 9    No results for input(s): PROT, ALBUMIN, AST, ALT, ALKPHOS, BILITOT in the last 168 hours. Hematology Recent Labs  Lab 02/17/20 2219  WBC 9.4  RBC 4.84  HGB 15.1  HCT 42.0  MCV 86.8  MCH 31.2  MCHC 36.0  RDW  12.2  PLT 243   BNPNo results for input(s): BNP, PROBNP in the last 168 hours.  DDimer No results for input(s): DDIMER in the last 168 hours.   Radiology/Studies:  DG Chest 2 View  Result Date: 02/17/2020 CLINICAL DATA:  Chest pain radiating into both arms. EXAM: CHEST - 2 VIEW COMPARISON:  Chest radiograph and 12/10/2017 FINDINGS: The cardiomediastinal contours are normal. Atherosclerosis of the thoracic aorta. Pulmonary vasculature is normal. No consolidation, pleural effusion, or pneumothorax. No acute osseous abnormalities are seen. IMPRESSION: No acute chest findings. Aortic Atherosclerosis (ICD10-I70.0). Electronically Signed   By: Narda Rutherford M.D.   On: 02/17/2020 22:33     Assessment and Plan:   1. CP/NSTEMI: HS troponin is abnormal x 1; will cont to cycle troponins. Pt is currently pain-free, on heparin IV gtt. Can start NTG gtt if pain returns. Plan for elective LHC in the AM to reevaluate for progression of CAD. Needs updated TSH, lipids, A1c for risk stratification.  2. HTN: restart home regimen and adjust PRN 3. Dyslipidemia: Last LDL was 54 on atorva 80. Updating FLP as above. LDL goal<70. Will restart atorva 80.  Risk Assessment/Risk Scores:     TIMI Risk Score for Unstable Angina or Non-ST Elevation MI:   The patient's TIMI risk score is 6, which indicates a 41% risk of all cause mortality, new or recurrent myocardial infarction or need for urgent revascularization in the next 14 days.     For questions or updates, please contact [facility] Please consult [email or web address] for contact info under     Signed, Precious Reel, MD, [facility]  02/18/2020 1:07 AM

## 2020-02-18 NOTE — Progress Notes (Signed)
ANTICOAGULATION CONSULT NOTE - Follow Up Consult  Pharmacy Consult for heparin Indication: chest pain/ACS  Labs: Recent Labs    02/17/20 2219 02/17/20 2357 02/18/20 0400 02/18/20 1410 02/18/20 2151  HGB 15.1  --  14.5  --   --   HCT 42.0  --  41.9  --   --   PLT 243  --  227  --   --   LABPROT  --   --  14.2  --   --   INR  --   --  1.1  --   --   HEPARINUNFRC  --   --   --  0.18* 0.17*  CREATININE 0.80  --  0.71  --   --   TROPONINIHS 796* 861* 1,147*  1,203*  --   --     Assessment: 69yo male subtherapeutic on heparin after rate change; no gtt issues or signs of bleeding per RN.  Goal of Therapy:  Heparin level 0.3-0.7 units/ml   Plan:  Will rebolus with heparin 3000 units and increase heparin gtt by 3 units/kg/hr to 1900 units/hr and check level in 6 hours.    Vernard Gambles, PharmD, BCPS  02/18/2020,11:54 PM

## 2020-02-18 NOTE — Progress Notes (Signed)
   Briefly seen today by myself and seen this am by Dr. Noemi Chapel for chest pain and rising troponin. Now chest pain free. History of occluded LCx by cath in 2019 with moderate LAD disease and ramus disease. Has had recent anginal . Troponin steadily rose to 1203, from an initial value of 796 and is trending down. Discussed definitive heart catheterization given his known CAD, chest pain symptoms and positive troponin with typical rise and fall of troponin. Also noted to be COVID positive but without symptoms - he has been vaccinated and boosted. Discussed risk, benefits and alternatives to cath with him and he is agreeable to proceed.  Chrystie Nose, MD, [facility], FACP  [place]  [facility]  Medical Director of the Advanced Lipid Disorders &  Cardiovascular Risk Reduction Clinic Diplomate of the American Board of Clinical Lipidology Attending Cardiologist  Direct Dial: [phone]  Fax: [phone]  Website:  [email or web address]

## 2020-02-18 NOTE — Progress Notes (Signed)
  Echocardiogram 2D Echocardiogram has been performed.  Aaron Summers 02/18/2020, 10:57 AM

## 2020-02-18 NOTE — ED Notes (Signed)
Pt reports no chest pain at this time.  

## 2020-02-18 NOTE — ED Notes (Signed)
Patient covid test reported to Dr Daphine Deutscher.

## 2020-02-18 NOTE — H&P (Signed)
History and Physical    Aaron Summers:[id number] DOB: April 09, 1950 DOA: 02/17/2020  PCP: Kirby Funk, MD Consultants:  Revankar - cardiology Patient coming from:  Home - lives with wife; NOK: Aaron Summers, [phone]  Chief Complaint: Chest pain  HPI: Aaron Summers is a 70 y.o. male with medical history significant of CAD s/p stent (2019); HLD; and HTN presenting with chest pain.  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  He noticed it during flight simulator training maybe a week ago and took 3 NTG at that time.  It has continued to wax and wane.  Last night, he and his wife went to dinner with their daughter in [place].  He took 3-5 NTG due to persistent CP and eventually decided to come to the ER when it did not resolve.  The pain is similar to his index symptoms from 2019.  He is fully vaccinated for COVID and has not had COVID-associated symptoms.    ED Course:  NSTEMI, COVID +.  Need consult for COVID.  Review of Systems: As per HPI; otherwise review of systems reviewed and negative.   Ambulatory Status:  Ambulates without assistance  COVID Vaccine Status:  Complete plus booster  Past Medical History:  Diagnosis Date  . CAD    a. LHC 12/11/2017: Ost Cx to Prox Cx 100% stenosed with large ramus branch supplying lateral wall, Ost LAD to Prox LAD 40% stenosed, mid LAD 50% stenosed, Ramus 70% stenosed  . Essential hypertension 03/05/2018  . Mixed dyslipidemia 03/05/2018  . NSTEMI (non-ST elevated myocardial infarction) (HCC) 12/10/2017    Past Surgical History:  Procedure Laterality Date  . FOREIGN BODY REMOVAL Left 05/16/2012   Procedure: FOREIGN BODY REMOVAL LEFT THUMB;  Surgeon: Tami Ribas, MD;  Location: [facility];  Service: Orthopedics;  Laterality: Left;  . FRACTURE SURGERY    . INGUINAL HERNIA REPAIR Left 1954  . LEFT HEART CATH AND CORONARY ANGIOGRAPHY N/A 12/11/2017   Procedure: LEFT HEART CATH AND CORONARY  ANGIOGRAPHY;  Surgeon: Swaziland, Peter M, MD;  Location: [facility] INVASIVE CV LAB;  Service: Cardiovascular;  Laterality: N/A;  . ORIF CLAVICULAR FRACTURE Right 1971    Social History   Socioeconomic History  . Marital status: Married    Spouse name: Not on file  . Number of children: Not on file  . Years of education: Not on file  . Highest education level: Not on file  Occupational History  . Not on file  Tobacco Use  . Smoking status: Never Smoker  . Smokeless tobacco: Never Used  Vaping Use  . Vaping Use: Never used  Substance and Sexual Activity  . Alcohol use: Never  . Drug use: No  . Sexual activity: Yes  Other Topics Concern  . Not on file  Social History Narrative  . Not on file   Social Determinants of Health   Financial Resource Strain: Not on file  Food Insecurity: Not on file  Transportation Needs: Not on file  Physical Activity: Not on file  Stress: Not on file  Social Connections: Not on file  Intimate Partner Violence: Not on file    No Known Allergies  Family History  Problem Relation Age of Onset  . Aortic aneurysm Mother   . Heart disease Father   . Cancer Maternal Grandfather     Prior to Admission medications   Medication Sig Start Date End Date Taking? Authorizing Provider  amLODipine-valsartan (EXFORGE) 10-320 MG tablet  Take 1 tablet by mouth daily.   Yes [provider]  aspirin EC 81 MG tablet Take 81 mg by mouth daily.   Yes [provider]  atorvastatin (LIPITOR) 80 MG tablet Take 80 mg by mouth daily.   Yes [provider]  clopidogrel (PLAVIX) 75 MG tablet Take 1 tablet (75 mg total) by mouth daily. 07/23/19  Yes Revankar, Aundra Dubin, MD  isosorbide mononitrate (IMDUR) 30 MG 24 hr tablet Take 1 tablet (30 mg total) by mouth daily. Patient needs appointment prior to any further refills. (2nd Attempt). 09/19/19  Yes Revankar, Aundra Dubin, MD  metoprolol tartrate (LOPRESSOR) 50 MG tablet TAKE 1 TABLET(50 MG) BY MOUTH TWICE  DAILY 01/14/20  Yes Revankar, Aundra Dubin, MD  nitroGLYCERIN (NITROSTAT) 0.4 MG SL tablet Place 1 tablet (0.4 mg total) under the tongue every 5 (five) minutes x 3 doses as needed for chest pain. 01/14/20  Yes Revankar, Aundra Dubin, MD    Physical Exam: Vitals:   02/18/20 0500 02/18/20 0600 02/18/20 0830 02/18/20 0858  BP: 105/63 (!) 114/57 118/85 119/75  Pulse: 71 70 68 74  Resp: 17 16 14    Temp:      TempSrc:      SpO2: 93% 95% 98%   Weight:      Height:         . General:  Appears calm and comfortable and is in NAD . Eyes:  PERRL, EOMI, normal lids, iris . ENT:  grossly normal hearing, lips & tongue, mmm; appropriate dentition . Neck:  no LAD, masses or thyromegaly . Cardiovascular:  RRR, no m/r/g. No LE edema.  Respiratory:   CTA bilaterally with no wheezes/rales/rhonchi.  Normal respiratory effort. . Abdomen:  soft, NT, ND, NABS . Skin:  no rash or induration seen on limited exam . Musculoskeletal:  grossly normal tone BUE/BLE, good ROM, no bony abnormality . Lower extremity:  No LE edema.  Limited foot exam with no ulcerations.  2+ distal pulses. Marland Kitchen Psychiatric:  grossly normal mood and affect, speech fluent and appropriate, AOx3 . Neurologic:  CN 2-12 grossly intact, moves all extremities in coordinated fashion    Radiological Exams on Admission: Independently reviewed - see discussion in A/P where applicable  DG Chest 2 View  Result Date: 02/17/2020 CLINICAL DATA:  Chest pain radiating into both arms. EXAM: CHEST - 2 VIEW COMPARISON:  Chest radiograph and 12/10/2017 FINDINGS: The cardiomediastinal contours are normal. Atherosclerosis of the thoracic aorta. Pulmonary vasculature is normal. No consolidation, pleural effusion, or pneumothorax. No acute osseous abnormalities are seen. IMPRESSION: No acute chest findings. Aortic Atherosclerosis (ICD10-I70.0). Electronically Signed   By: 14/09/2017 M.D.   On: 02/17/2020 22:33    EKG: Independently reviewed.   2151 - Sinus  tachycardia with rate 108; LVH; nonspecific ST changes  2359 - NSR with rate 93; LVH; nonspecific ST changes with NSCSLT 0346 - NSR with rate 70; LVH; nonspecific ST changes    Labs on Admission: I have personally reviewed the available labs and imaging studies at the time of the admission.  Pertinent labs:   Glucose 220 HS troponin 1203, 1147 Lipids: 108/25/46/187 Normal CBC INR 1.1 TSH 4.920 COVID POSITIVE   Assessment/Plan Principal Problem:   NSTEMI (non-ST elevated myocardial infarction) (HCC) Active Problems:   Essential hypertension   Mixed dyslipidemia   COVID-19   Class 1 obesity due to excess calories with body mass index (BMI) of 33.0 to 33.9 in adult   ACS -Patient with substernal  chest pain that appears to be exertional and is escalating recently, improved with repeated doses of NTG -3/3 typical symptoms suggestive of typical angina -CXR unremarkable.   -Initial HS troponin positive; repeat with negative delta - suggestive of NSTEMI -EKG with nonspecific ST changes, no apparent STEMI. -TIMI risk score is 6; which predicts a 14 days risk of death, recurrent MI, or urgent revascularization of 41%.  -Patient is admitted to the cardiology service, on heparin drip, with plan for cardiac catheterization tomorrow  Incidental COVID -Patient with 3 immunizations and no apparent symptoms, but positive today -No O2 requirement -COVID POSITIVE -The patient has comorbidities which may increase the risk for ARDS/MODS including:  HTN, CAD, obesity -COVID labs requested -Negative CXR -Will order Remdesivir (pharmacy consult) given +COVID test for incidental COVID infection  HTN -Continue beta-blocker -Exforge is currently on hold, will defer to primary team  HLD -Continue Lipitor  Obesity -Body mass index is 33.56 kg/m..  -Weight loss should be encouraged -Outpatient PCP/bariatric medicine f/u encouraged    DVT prophylaxis: Heparin drip Family Communication:  I spoke with his wife by telephone at the time of his admission Disposition Plan:  The patient is from: home  Anticipated d/c is to: home without [facility] services    Thank you for this interesting consult.  We will be happy to continue to follow at this time to ensure no development of COVID-related symptoms.   Jonah Blue MD [facility]   How to contact the [facility] Attending or Consulting provider 7A - 7P or covering provider during after hours 7P -7A, for this patient?  1. Check the care team in [facility] and look for a) attending/consulting TRH provider listed and b) the [facility] team listed 2. Log into [email or web address] and use [place]'s universal password to access. If you do not have the password, please contact the hospital operator. 3. Locate the [facility] provider you are looking for under [facility] and page to a number that you can be directly reached. 4. If you still have difficulty reaching the provider, please page the [facility] (Director on Call) for the Hospitalists listed on amion for assistance.   02/18/2020, 12:09 PM

## 2020-02-18 NOTE — Plan of Care (Signed)

## 2020-02-18 NOTE — Progress Notes (Signed)
Inpatient Diabetes Program Recommendations  AACE/ADA: New Consensus Statement on Inpatient Glycemic Control   Target Ranges:  Prepandial:   less than 140 mg/dL      Peak postprandial:   less than 180 mg/dL (1-2 hours)      Critically ill patients:  140 - 180 mg/dL   Results for ERVING, SASSANO (MRN [id number]) as of 02/18/2020 14:09  Ref. Range 02/17/2020 22:19 02/18/2020 04:00  Glucose Latest Ref Range: 70 - 99 mg/dL 409 (H) 811 (H)   Review of Glycemic Control  Diabetes history: DM2 Outpatient Diabetes medications: none Current orders for Inpatient glycemic control: None  Inpatient Diabetes Program Recommendations:    Insulin: Please consider ordering CBGs with Novolog 0-15 units TID with meals and Novolog 0-5 units QHS.  NOTE: In reviewing chart, noted in care everywhere, per note on 02/16/20 by Dr. Valentina Lucks, patient has DM2 hx and A1C was 7.4% on 01/15/20. Noted current A1C in process.  Thanks, Orlando Penner, RN, MSN, CDE Diabetes Coordinator Inpatient Diabetes Program [phone] (Team Pager from 8am to 5pm)

## 2020-02-19 ENCOUNTER — Inpatient Hospital Stay (HOSPITAL_COMMUNITY)
Admission: EM | Disposition: A | Discharge status: Home / Self Care | Payer: Self-pay | Source: Home / Self Care | Attending: Internal Medicine

## 2020-02-19 DIAGNOSIS — E1165 Type 2 diabetes mellitus with hyperglycemia: Secondary | ICD-10-CM

## 2020-02-19 DIAGNOSIS — E782 Mixed hyperlipidemia: Secondary | ICD-10-CM | POA: Diagnosis not present

## 2020-02-19 DIAGNOSIS — I2510 Atherosclerotic heart disease of native coronary artery without angina pectoris: Secondary | ICD-10-CM

## 2020-02-19 DIAGNOSIS — U071 COVID-19: Secondary | ICD-10-CM | POA: Diagnosis not present

## 2020-02-19 DIAGNOSIS — I214 Non-ST elevation (NSTEMI) myocardial infarction: Secondary | ICD-10-CM | POA: Diagnosis not present

## 2020-02-19 DIAGNOSIS — I10 Essential (primary) hypertension: Secondary | ICD-10-CM | POA: Diagnosis not present

## 2020-02-19 HISTORY — PX: CORONARY STENT INTERVENTION: CATH118234

## 2020-02-19 HISTORY — PX: LEFT HEART CATH AND CORONARY ANGIOGRAPHY: CATH118249

## 2020-02-19 LAB — POCT ACTIVATED CLOTTING TIME: Activated Clotting Time: 726 seconds

## 2020-02-19 LAB — GLUCOSE, CAPILLARY
Glucose-Capillary: 281 mg/dL — ABNORMAL HIGH (ref 70–99)
Glucose-Capillary: 180 mg/dL — ABNORMAL HIGH (ref 70–99)
Glucose-Capillary: 101 mg/dL — ABNORMAL HIGH (ref 70–99)
Glucose-Capillary: 190 mg/dL — ABNORMAL HIGH (ref 70–99)

## 2020-02-19 LAB — CBC
HCT: 40 % (ref 39.0–52.0)
Hemoglobin: 13.7 g/dL (ref 13.0–17.0)
MCH: 30.4 pg (ref 26.0–34.0)
MCHC: 34.3 g/dL (ref 30.0–36.0)
MCV: 88.7 fL (ref 80.0–100.0)
Platelets: 182 10*3/uL (ref 150–400)
RBC: 4.51 MIL/uL (ref 4.22–5.81)
RDW: 12.2 % (ref 11.5–15.5)
WBC: 6.6 10*3/uL (ref 4.0–10.5)
nRBC: 0 % (ref 0.0–0.2)

## 2020-02-19 LAB — CREATININE, SERUM
Creatinine, Ser: 0.88 mg/dL (ref 0.61–1.24)
GFR, Estimated: >60 mL/min (ref >60)

## 2020-02-19 LAB — HEPARIN LEVEL (UNFRACTIONATED): Heparin Unfractionated: 0.36 IU/mL (ref 0.30–0.70)

## 2020-02-19 LAB — HEMOGLOBIN A1C
Hgb A1c MFr Bld: 8.6 % — ABNORMAL HIGH (ref 4.8–5.6)
Mean Plasma Glucose: 200 mg/dL

## 2020-02-19 SURGERY — LEFT HEART CATH AND CORONARY ANGIOGRAPHY
Anesthesia: LOCAL

## 2020-02-19 MED ORDER — SODIUM CHLORIDE 0.9 % IV SOLN: 250.0000 mL | INTRAVENOUS | Status: DC | PRN

## 2020-02-19 MED ORDER — VERAPAMIL HCL 2.5 MG/ML IV SOLN
INTRAVENOUS | Status: AC
  Filled 2020-02-19: qty 2

## 2020-02-19 MED ORDER — HEPARIN (PORCINE) IN NACL 1000-0.9 UT/500ML-% IV SOLN
INTRAVENOUS | Status: DC | PRN
  Administered 2020-02-19 (×2): 500 mL

## 2020-02-19 MED ORDER — FENTANYL CITRATE (PF) 100 MCG/2ML IJ SOLN
INTRAMUSCULAR | Status: DC | PRN
  Administered 2020-02-19: 25 ug via INTRAVENOUS

## 2020-02-19 MED ORDER — ENOXAPARIN SODIUM 40 MG/0.4ML ~~LOC~~ SOLN
40.0000 mg | SUBCUTANEOUS | Status: DC
  Administered 2020-02-20: 40 mg via SUBCUTANEOUS
  Filled 2020-02-19: qty 0.4

## 2020-02-19 MED ORDER — SODIUM CHLORIDE 0.9% FLUSH
3.0000 mL | Freq: Two times a day (BID) | INTRAVENOUS | Status: DC
  Administered 2020-02-20: 3 mL via INTRAVENOUS

## 2020-02-19 MED ORDER — LIDOCAINE HCL (PF) 1 % IJ SOLN
INTRAMUSCULAR | Status: AC
  Filled 2020-02-19: qty 30

## 2020-02-19 MED ORDER — HYDRALAZINE HCL 20 MG/ML IJ SOLN: 10.0000 mg | INTRAMUSCULAR | Status: AC | PRN

## 2020-02-19 MED ORDER — FENTANYL CITRATE (PF) 100 MCG/2ML IJ SOLN
INTRAMUSCULAR | Status: AC
  Filled 2020-02-19: qty 2

## 2020-02-19 MED ORDER — IOHEXOL 350 MG/ML SOLN
INTRAVENOUS | Status: DC | PRN
  Administered 2020-02-19: 110 mL

## 2020-02-19 MED ORDER — VERAPAMIL HCL 2.5 MG/ML IV SOLN
INTRAVENOUS | Status: DC | PRN
  Administered 2020-02-19: 10 mL via INTRA_ARTERIAL

## 2020-02-19 MED ORDER — HEPARIN (PORCINE) IN NACL 1000-0.9 UT/500ML-% IV SOLN
INTRAVENOUS | Status: AC
  Filled 2020-02-19: qty 1000

## 2020-02-19 MED ORDER — MIDAZOLAM HCL 2 MG/2ML IJ SOLN
INTRAMUSCULAR | Status: DC | PRN
  Administered 2020-02-19: 1 mg via INTRAVENOUS

## 2020-02-19 MED ORDER — MIDAZOLAM HCL 2 MG/2ML IJ SOLN
INTRAMUSCULAR | Status: AC
  Filled 2020-02-19: qty 2

## 2020-02-19 MED ORDER — INSULIN DETEMIR 100 UNIT/ML ~~LOC~~ SOLN
12.0000 [IU] | Freq: Two times a day (BID) | SUBCUTANEOUS | Status: DC
  Administered 2020-02-19 – 2020-02-20 (×3): 12 [IU] via SUBCUTANEOUS
  Filled 2020-02-19 (×5): qty 0.12

## 2020-02-19 MED ORDER — HEPARIN SODIUM (PORCINE) 1000 UNIT/ML IJ SOLN
INTRAMUSCULAR | Status: DC | PRN
  Administered 2020-02-19: 5000 [IU] via INTRAVENOUS
  Administered 2020-02-19: 6000 [IU] via INTRAVENOUS

## 2020-02-19 MED ORDER — LIDOCAINE HCL (PF) 1 % IJ SOLN
INTRAMUSCULAR | Status: DC | PRN
  Administered 2020-02-19: 2 mL

## 2020-02-19 MED ORDER — NITROGLYCERIN 1 MG/10 ML FOR IR/CATH LAB
INTRA_ARTERIAL | Status: AC
  Filled 2020-02-19: qty 10

## 2020-02-19 MED ORDER — SODIUM CHLORIDE 0.9% FLUSH: 3.0000 mL | INTRAVENOUS | Status: DC | PRN

## 2020-02-19 MED ORDER — HEPARIN SODIUM (PORCINE) 1000 UNIT/ML IJ SOLN
INTRAMUSCULAR | Status: AC
  Filled 2020-02-19: qty 1

## 2020-02-19 SURGICAL SUPPLY — 18 items
BAG SNAP BAND KOVER 36X36 (MISCELLANEOUS) ×1 IMPLANT
BALLN EMERGE MR 2.5X12 (BALLOONS) ×2
BALLN SAPPHIRE ~~LOC~~ 4.0X12 (BALLOONS) ×1 IMPLANT
BALLOON EMERGE MR 2.5X12 (BALLOONS) IMPLANT
CATH INFINITI 5FR MULTPACK ANG (CATHETERS) ×1 IMPLANT
CATH VISTA GUIDE 6FR XBLAD3.5 (CATHETERS) ×1 IMPLANT
COVER DOME SNAP 22 D (MISCELLANEOUS) ×1 IMPLANT
DEVICE RAD COMP TR BAND LRG (VASCULAR PRODUCTS) ×1 IMPLANT
GLIDESHEATH SLEND SS 6F .021 (SHEATH) ×1 IMPLANT
GUIDEWIRE INQWIRE 1.5J.035X260 (WIRE) IMPLANT
INQWIRE 1.5J .035X260CM (WIRE) ×2
KIT ENCORE 26 ADVANTAGE (KITS) ×1 IMPLANT
KIT HEART LEFT (KITS) ×2 IMPLANT
PACK CARDIAC CATHETERIZATION (CUSTOM PROCEDURE TRAY) ×2 IMPLANT
STENT RESOLUTE ONYX 4.0X15 (Permanent Stent) ×1 IMPLANT
TRANSDUCER W/STOPCOCK (MISCELLANEOUS) ×2 IMPLANT
TUBING CIL FLEX 10 FLL-RA (TUBING) ×2 IMPLANT
WIRE ASAHI PROWATER 180CM (WIRE) ×1 IMPLANT

## 2020-02-19 NOTE — Progress Notes (Signed)
ANTICOAGULATION CONSULT NOTE - Initial Consult  Pharmacy Consult for Heparin  Indication: chest pain/ACS  No Known Allergies  Patient Measurements: Height: 6\' 2"  (188 cm) Weight: 118.6 kg (261 lb 6.4 oz) IBW/kg (Calculated) : 82.2  Vital Signs: Temp: 98 F (36.7 C) (02/17 0414) Temp Source: Oral (02/17 0414) BP: 116/73 (02/17 0414) Pulse Rate: 72 (02/17 0414)  Labs: Recent Labs    02/17/20 2219 02/17/20 2357 02/18/20 0400 02/18/20 1410 02/18/20 2151 02/19/20 0614  HGB 15.1  --  14.5  --   --  13.7  HCT 42.0  --  41.9  --   --  40.0  PLT 243  --  227  --   --  182  LABPROT  --   --  14.2  --   --   --   INR  --   --  1.1  --   --   --   HEPARINUNFRC  --   --   --  0.18* 0.17* 0.36  CREATININE 0.80  --  0.71  --   --   --   TROPONINIHS 796* 861* 1,147*  1,203*  --   --   --     Estimated Creatinine Clearance: 121 mL/min (by C-G formula based on SCr of 0.71 mg/dL).   Medical History: Past Medical History:  Diagnosis Date  . CAD    a. LHC 12/11/2017: Ost Cx to Prox Cx 100% stenosed with large ramus branch supplying lateral wall, Ost LAD to Prox LAD 40% stenosed, mid LAD 50% stenosed, Ramus 70% stenosed  . Essential hypertension 03/05/2018  . Mixed dyslipidemia 03/05/2018  . NSTEMI (non-ST elevated myocardial infarction) (HCC) 12/10/2017    Assessment: 70 y/o M with chest pain and elevated troponin. Starting heparin. CBC/renal function good. PTA meds reviewed.   Heparin level came back therapeutic this AM. Plan for cath today.   Goal of Therapy:  Heparin level 0.3-0.7 units/ml Monitor platelets by anticoagulation protocol: Yes   Plan:  Continue heparin drip at 1900 units/hr F/u after cath Daily CBC/Heparin level   73, PharmD, BCIDP, AAHIVP, CPP Infectious Disease Pharmacist 02/19/2020 9:06 AM

## 2020-02-19 NOTE — Progress Notes (Signed)
PROGRESS NOTE                                                                                                                                                                                                             Patient Demographics:    Aaron Summers, is a 70 y.o. male, DOB - Aug 10, 1950, MOQ:[id number]  Outpatient Primary MD for the patient is Kirby Funk, MD   Admit date - 02/17/2020   LOS - 1  Chief Complaint  Patient presents with  . Chest Pain       Brief Narrative: Patient is a 70 y.o. male with PMHx of CAD-presenting with chest pain-found to have a non-STEMI and incidental COVID-19 infection.  COVID-19 vaccinated status: Vaccinated and boosted  Significant studies: 2/15>>Chest x-ray: No pneumonia  COVID-19 medications: Remdesivir:2/16>>  Antibiotics: None  Microbiology data: None  DVT prophylaxis: IV heparin    Subjective:    Cipriano Bunker today denies any shortness of breath/cough-on room air.   Assessment  & Plan :   Incidental COVID-19 infection: Asymptomatic-fully vaccinated-apart from 3 days of IV Remdesivir-and continued observation-patient does not need any other therapies.  If he remains hospitalized-needs 10 days of isolation per protocol.  I will sign off-please call [facility] back prn.   Non-STEMI: Per primary service  Condition - Stable  Family Communication  : Defer to primary service  Code Status :  Full Code  Diet :  Diet Order            Diet NPO time specified  Diet effective ____                  Disposition Plan  :  Per primary service  Antimicorbials  :    Anti-infectives (From admission, onward)   Start     Dose/Rate Route Frequency Ordered Stop   02/19/20 1000  remdesivir 100 mg in sodium chloride 0.9 % 100 mL IVPB       "Followed by" Linked Group Details   100 mg 200 mL/hr over 30 Minutes Intravenous Daily 02/18/20 1155 02/21/20 0959   02/18/20  1200  remdesivir 200 mg in sodium chloride 0.9% 250 mL IVPB       "Followed by" Linked Group Details   200 mg 580 mL/hr over 30 Minutes Intravenous Once 02/18/20 1155 02/18/20 1625  Inpatient Medications  Scheduled Meds: . albuterol  2 puff Inhalation Q6H  . aspirin EC  81 mg Oral Daily  . atorvastatin  80 mg Oral Daily  . clopidogrel  75 mg Oral Q breakfast  . influenza vaccine adjuvanted  0.5 mL Intramuscular Tomorrow-1000  . insulin aspart  0-15 Units Subcutaneous TID WC  . insulin aspart  0-5 Units Subcutaneous QHS  . insulin detemir  12 Units Subcutaneous BID  . isosorbide mononitrate  30 mg Oral Daily  . metoprolol tartrate  50 mg Oral BID  . sodium chloride flush  3 mL Intravenous Q12H   Continuous Infusions: . sodium chloride    . sodium chloride    . heparin 1,900 Units/hr (02/19/20 0539)  . remdesivir 100 mg in NS 100 mL 100 mg (02/19/20 0856)   PRN Meds:.sodium chloride, acetaminophen, ALPRAZolam, nitroGLYCERIN, ondansetron (ZOFRAN) IV, sodium chloride flush, zolpidem   Time Spent in minutes  25  See all Orders from today for further details   Jeoffrey MassedShanker Macallister Ashmead M.D on 02/19/2020 at 11:09 AM  To page go to [email or web address] - use universal password  [facility] -  Office  [phone]    Objective:   Vitals:   02/18/20 1504 02/18/20 1950 02/18/20 2330 02/19/20 0414  BP: 128/76 134/69 132/76 116/73  Pulse: 83 81 76 72  Resp: 18 19 (!) 21 16  Temp: 99 F (37.2 C) 98.7 F (37.1 C) 98.7 F (37.1 C) 98 F (36.7 C)  TempSrc: Oral Oral Oral Oral  SpO2: 95% 95% 93% 96%  Weight:      Height:        Wt Readings from Last 3 Encounters:  02/18/20 118.6 kg  01/14/20 119.7 kg  03/05/18 116.1 kg     Intake/Output Summary (Last 24 hours) at 02/19/2020 1109 Last data filed at 02/19/2020 0300 Gross per 24 hour  Intake 458.85 ml  Output --  Net 458.85 ml     Physical Exam Gen Exam:Alert awake-not in any distress HEENT:atraumatic,  normocephalic Chest: B/L clear to auscultation anteriorly CVS:S1S2 regular Abdomen:soft non tender, non distended Extremities:no edema Neurology: Non focal Skin: no rash   Data Review:    CBC Recent Labs  Lab 02/17/20 2219 02/18/20 0400 02/19/20 0614  WBC 9.4 9.7 6.6  HGB 15.1 14.5 13.7  HCT 42.0 41.9 40.0  PLT 243 227 182  MCV 86.8 88.8 88.7  MCH 31.2 30.7 30.4  MCHC 36.0 34.6 34.3  RDW 12.2 12.1 12.2    Chemistries  Recent Labs  Lab 02/17/20 2219 02/18/20 0400  NA 133* 135  K 3.8 3.9  CL 101 102  CO2 23 21*  GLUCOSE 297* 220*  BUN 17 16  CREATININE 0.80 0.71  CALCIUM 9.3 9.1   ------------------------------------------------------------------------------------------------------------------ Recent Labs    02/18/20 0400  CHOL 108  HDL 25*  LDLCALC 46  TRIG 098187*  CHOLHDL 4.3    Lab Results  Component Value Date   HGBA1C 8.6 (H) 02/18/2020   ------------------------------------------------------------------------------------------------------------------ Recent Labs    02/18/20 0400  TSH 4.920*   ------------------------------------------------------------------------------------------------------------------ Recent Labs    02/18/20 1410  FERRITIN 279    Coagulation profile Recent Labs  Lab 02/18/20 0400  INR 1.1    Recent Labs    02/18/20 0902  DDIMER <0.27    Cardiac Enzymes No results for input(s): CKMB, TROPONINI, MYOGLOBIN in the last 168 hours.  Invalid input(s): CK ------------------------------------------------------------------------------------------------------------------ No results found for: BNP  Micro Results Recent Results (from the past 240 hour(s))  Resp Panel by RT-PCR (Flu A&B, Covid) Nasopharyngeal Swab     Status: Abnormal   Collection Time: 02/18/20 12:47 AM   Specimen: Nasopharyngeal Swab; Nasopharyngeal(NP) swabs in vial transport medium  Result Value Ref Range Status   SARS Coronavirus 2 by RT PCR  POSITIVE (A) NEGATIVE Final    Comment: RESULT CALLED TO, READ BACK BY AND VERIFIED WITH: WOODY RN 02/18/20 0209 JDW (NOTE) SARS-CoV-2 target nucleic acids are DETECTED.  The SARS-CoV-2 RNA is generally detectable in upper respiratory specimens during the acute phase of infection. Positive results are indicative of the presence of the identified virus, but do not rule out bacterial infection or co-infection with other pathogens not detected by the test. Clinical correlation with patient history and other diagnostic information is necessary to determine patient infection status. The expected result is Negative.  Fact Sheet for Patients: BloggerCourse.com  Fact Sheet for Healthcare Providers: SeriousBroker.it  This test is not yet approved or cleared by the [place] FDA and  has been authorized for detection and/or diagnosis of SARS-CoV-2 by FDA under an Emergency Use Authorization (EUA).  This EUA will remain in effect (meaning this test can be used) f or the duration of  the COVID-19 declaration under Section 564(b)(1) of the Act, 21 U.S.C. section 360bbb-3(b)(1), unless the authorization is terminated or revoked sooner.     Influenza A by PCR NEGATIVE NEGATIVE Final   Influenza B by PCR NEGATIVE NEGATIVE Final    Comment: (NOTE) The Xpert Xpress SARS-CoV-2/FLU/RSV plus assay is intended as an aid in the diagnosis of influenza from Nasopharyngeal swab specimens and should not be used as a sole basis for treatment. Nasal washings and aspirates are unacceptable for Xpert Xpress SARS-CoV-2/FLU/RSV testing.  Fact Sheet for Patients: BloggerCourse.com  Fact Sheet for Healthcare Providers: SeriousBroker.it  This test is not yet approved or cleared by the [place] FDA and has been authorized for detection and/or diagnosis of SARS-CoV-2 by FDA under an Emergency Use  Authorization (EUA). This EUA will remain in effect (meaning this test can be used) for the duration of the COVID-19 declaration under Section 564(b)(1) of the Act, 21 U.S.C. section 360bbb-3(b)(1), unless the authorization is terminated or revoked.  Performed at [facility], [address] [place], [place] [address]     Radiology Reports DG Chest 2 View  Result Date: 02/17/2020 CLINICAL DATA:  Chest pain radiating into both arms. EXAM: CHEST - 2 VIEW COMPARISON:  Chest radiograph and 12/10/2017 FINDINGS: The cardiomediastinal contours are normal. Atherosclerosis of the thoracic aorta. Pulmonary vasculature is normal. No consolidation, pleural effusion, or pneumothorax. No acute osseous abnormalities are seen. IMPRESSION: No acute chest findings. Aortic Atherosclerosis (ICD10-I70.0). Electronically Signed   By: Narda Rutherford M.D.   On: 02/17/2020 22:33   ECHOCARDIOGRAM COMPLETE  Result Date: 02/18/2020    ECHOCARDIOGRAM REPORT   Patient Name:   DEJUAN ELMAN Date of Exam: 02/18/2020 Medical Rec #:  [id number]         Height:       74.0 in Accession #:    [id number]        Weight:       261.4 lb Date of Birth:  1950/05/22         BSA:          2.435 m Patient Age:    69 years          BP:           119/75 mmHg Patient  Gender: M                 HR:           52 bpm. Exam Location:  Inpatient Procedure: 2D Echo, Cardiac Doppler, Color Doppler and Intracardiac            Opacification Agent Indications:    R07.9* Chest pain, unspecified; 121-121.4 ST elevation (STEMI)                 and non-ST elevation (NSTEMI) myocardial infarction  History:        Patient has prior history of Echocardiogram examinations, most                 recent 12/11/2017. Previous Myocardial Infarction and CAD,                 Abnormal ECG, Signs/Symptoms:Chest Pain; Risk                 Factors:Hypertension and Dyslipidemia. Covid positive.  Sonographer:    Sheralyn Boatman RDCS Referring Phys: [id number] [facility]  MARTIN  Sonographer Comments: Technically difficult study due to poor echo windows. Image acquisition challenging due to patient body habitus. IMPRESSIONS  1. Left ventricular ejection fraction, by estimation, is 35 to 40%. The left ventricle has moderately decreased function. The left ventricle demonstrates regional wall motion abnormalities (see scoring diagram/findings for description). There is mild left ventricular hypertrophy. Left ventricular diastolic parameters are indeterminate.  2. Right ventricular systolic function is normal. The right ventricular size is normal. There is normal pulmonary artery systolic pressure.  3. The mitral valve is normal in structure. No evidence of mitral valve regurgitation.  4. The aortic valve is tricuspid. Aortic valve regurgitation is not visualized. No aortic stenosis is present.  5. Aortic dilatation noted. There is mild dilatation of the ascending aorta, measuring 40 mm. FINDINGS  Left Ventricle: Left ventricular ejection fraction, by estimation, is 35 to 40%. The left ventricle has moderately decreased function. The left ventricle demonstrates regional wall motion abnormalities. Definity contrast agent was given IV to delineate the left ventricular endocardial borders. The left ventricular internal cavity size was normal in size. There is mild left ventricular hypertrophy. Left ventricular diastolic parameters are indeterminate.  LV Wall Scoring: The mid and distal anterior septum, apical anterior segment, and apex are akinetic. The basal anteroseptal segment and mid anterior segment are hypokinetic. The entire lateral wall, entire inferior wall, mid inferoseptal segment, basal anterior segment, and basal inferoseptal segment are normal. Right Ventricle: The right ventricular size is normal. No increase in right ventricular wall thickness. Right ventricular systolic function is normal. There is normal pulmonary artery systolic pressure. The tricuspid regurgitant  velocity is 2.15 m/s, and  with an assumed right atrial pressure of 3 mmHg, the estimated right ventricular systolic pressure is 21.5 mmHg. Left Atrium: Left atrial size was normal in size. Right Atrium: Right atrial size was normal in size. Pericardium: Trivial pericardial effusion is present. Mitral Valve: The mitral valve is normal in structure. No evidence of mitral valve regurgitation. Tricuspid Valve: The tricuspid valve is normal in structure. Tricuspid valve regurgitation is trivial. Aortic Valve: The aortic valve is tricuspid. Aortic valve regurgitation is not visualized. No aortic stenosis is present. Pulmonic Valve: The pulmonic valve was not well visualized. Pulmonic valve regurgitation is not visualized. Aorta: The aortic root is normal in size and structure and aortic dilatation noted. There is mild dilatation of the ascending aorta, measuring 40 mm. IAS/Shunts:  The interatrial septum was not well visualized.  LEFT VENTRICLE PLAX 2D LVIDd:         4.90 cm      Diastology LV PW:         1.10 cm      LV e' medial:    6.53 cm/s LV IVS:        1.10 cm      LV E/e' medial:  8.6 LVOT diam:     2.20 cm      LV e' lateral:   7.07 cm/s LV SV:         51           LV E/e' lateral: 7.9 LV SV Index:   21 LVOT Area:     3.80 cm  LV Volumes (MOD) LV vol d, MOD A2C: 149.0 ml LV vol d, MOD A4C: 121.5 ml LV vol s, MOD A2C: 88.5 ml LV vol s, MOD A4C: 80.0 ml LV SV MOD A2C:     60.5 ml LV SV MOD A4C:     121.5 ml LV SV MOD BP:      54.6 ml RIGHT VENTRICLE             IVC RV S prime:     10.00 cm/s  IVC diam: 1.90 cm TAPSE (M-mode): 2.0 cm LEFT ATRIUM             Index       RIGHT ATRIUM          Index LA diam:        3.75 cm 1.54 cm/m  RA Area:     9.03 cm LA Vol (A2C):   49.8 ml 20.45 ml/m RA Volume:   16.90 ml 6.94 ml/m LA Vol (A4C):   40.5 ml 16.63 ml/m LA Biplane Vol: 45.6 ml 18.72 ml/m  AORTIC VALVE LVOT Vmax:   61.10 cm/s LVOT Vmean:  49.100 cm/s LVOT VTI:    0.134 m  AORTA Ao Root diam: 3.50 cm Ao Asc diam:   4.00 cm MITRAL VALVE               TRICUSPID VALVE MV Area (PHT): 3.15 cm    TR Peak grad:   18.5 mmHg MV Decel Time: 241 msec    TR Vmax:        215.00 cm/s MV E velocity: 56.10 cm/s MV A velocity: 35.30 cm/s  SHUNTS MV E/A ratio:  1.59        Systemic VTI:  0.13 m                            Systemic Diam: 2.20 cm Epifanio Lesches MD Electronically signed by Epifanio Lesches MD Signature Date/Time: 02/18/2020/1:27:17 PM    Final

## 2020-02-19 NOTE — Progress Notes (Signed)
Inpatient Diabetes Program Recommendations  AACE/ADA: New Consensus Statement on Inpatient Glycemic Control (2015)  Target Ranges:  Prepandial:   less than 140 mg/dL      Peak postprandial:   less than 180 mg/dL (1-2 hours)      Critically ill patients:  140 - 180 mg/dL   Lab Results  Component Value Date   GLUCAP 281 (H) 02/19/2020   HGBA1C 8.6 (H) 02/18/2020    Review of Glycemic Control Results for ERCIL, CASSIS (MRN [id number]) as of 02/19/2020 09:43  Ref. Range 02/18/2020 17:39 02/18/2020 19:51 02/19/2020 08:46  Glucose-Capillary Latest Ref Range: 70 - 99 mg/dL 510 (H) 258 (H) 527 (H)   Diabetes history: DM 2 Outpatient Diabetes medications:  No meds listed Current orders for Inpatient glycemic control:  Novolog moderate tid with meals and HS Inpatient Diabetes Program Recommendations:    Please consider adding Levemir 12 units bid while in the hospital.   Thanks,  Beryl Meager, RN, BC-ADM Inpatient Diabetes Coordinator Pager [phone] (8a-5p)

## 2020-02-19 NOTE — Plan of Care (Signed)
Patient is currently resting in bed. No complaints overnight. OOB ambulating to BR. VSS. When sleeping patient destated a little, placed on 2L [place]. Room air when awake. Hep gtt infusing. NPO. Call bell within reach.   Problem: Education: Goal: Knowledge of risk factors and measures for prevention of condition will improve Outcome: Progressing   Problem: Coping: Goal: Psychosocial and spiritual needs will be supported Outcome: Progressing   Problem: Respiratory: Goal: Will maintain a patent airway Outcome: Progressing Goal: Complications related to the disease process, condition or treatment will be avoided or minimized Outcome: Progressing

## 2020-02-19 NOTE — Progress Notes (Signed)
Progress Note  Patient Name: Aaron Summers Date of Encounter: 02/19/2020  [facility] Cardiologist: Lyn Records III, MD   Subjective   No chest pain overnight. No shortness of breath. Echo yesterday shows LVEF depressed at 35-40% with mid to distal anteroseptal, apical anterior and apical hypokinesis. 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   Inpatient Medications    Scheduled Meds: . albuterol  2 puff Inhalation Q6H  . aspirin EC  81 mg Oral Daily  . atorvastatin  80 mg Oral Daily  . clopidogrel  75 mg Oral Q breakfast  . influenza vaccine adjuvanted  0.5 mL Intramuscular Tomorrow-1000  . insulin aspart  0-15 Units Subcutaneous TID WC  . insulin aspart  0-5 Units Subcutaneous QHS  . isosorbide mononitrate  30 mg Oral Daily  . metoprolol tartrate  50 mg Oral BID  . sodium chloride flush  3 mL Intravenous Q12H   Continuous Infusions: . sodium chloride    . sodium chloride    . heparin 1,900 Units/hr (02/19/20 0539)  . remdesivir 100 mg in NS 100 mL 100 mg (02/19/20 0856)   PRN Meds: sodium chloride, acetaminophen, ALPRAZolam, nitroGLYCERIN, ondansetron (ZOFRAN) IV, sodium chloride flush, zolpidem   Vital Signs    Vitals:   02/18/20 1504 02/18/20 1950 02/18/20 2330 02/19/20 0414  BP: 128/76 134/69 132/76 116/73  Pulse: 83 81 76 72  Resp: 18 19 (!) 21 16  Temp: 99 F (37.2 C) 98.7 F (37.1 C) 98.7 F (37.1 C) 98 F (36.7 C)  TempSrc: Oral Oral Oral Oral  SpO2: 95% 95% 93% 96%  Weight:      Height:        Intake/Output Summary (Last 24 hours) at 02/19/2020 0912 Last data filed at 02/19/2020 0300 Gross per 24 hour  Intake 458.85 ml  Output --  Net 458.85 ml   Last 3 Weights 02/18/2020 01/14/2020 03/05/2018  Weight (lbs) 261 lb 6.4 oz 264 lb 256 lb  Weight (kg) 118.57 kg  119.75 kg 116.121 kg      Telemetry    Sinus rhythm - Personally Reviewed  ECG    NSR at 65 with anterolateral TWI's - Personally Reviewed  Physical Exam   General appearance: alert and no distress Neck: no carotid bruit, no JVD and thyroid not enlarged, symmetric, no tenderness/mass/nodules Lungs: clear to auscultation bilaterally Heart: regular rate and rhythm Abdomen: soft, non-tender; bowel sounds normal; no masses,  no organomegaly Extremities: edema trace bilateral Pulses: 2+ and symmetric Skin: Skin color, texture, turgor normal. No rashes or lesions Neurologic: Grossly normal Psych: Pleasant   Labs    High Sensitivity Troponin:   Recent Labs  Lab 02/17/20 2219 02/17/20 2357 02/18/20 0400  TROPONINIHS 796* 861* 1,147*  1,203*      Chemistry Recent Labs  Lab 02/17/20 2219 02/18/20 0400  NA 133* 135  K 3.8 3.9  CL 101 102  CO2 23 21*  GLUCOSE 297* 220*  BUN 17 16  CREATININE 0.80 0.71  CALCIUM 9.3 9.1  GFRNONAA >60 >60  ANIONGAP 9 12     Hematology Recent Labs  Lab 02/17/20 2219 02/18/20 0400 02/19/20 0614  WBC 9.4 9.7 6.6  RBC 4.84 4.72 4.51  HGB 15.1 14.5 13.7  HCT 42.0 41.9 40.0  MCV 86.8  88.8 88.7  MCH 31.2 30.7 30.4  MCHC 36.0 34.6 34.3  RDW 12.2 12.1 12.2  PLT 243 227 182    BNPNo results for input(s): BNP, PROBNP in the last 168 hours.   DDimer  Recent Labs  Lab 02/18/20 0902  DDIMER <0.27     Radiology    DG Chest 2 View  Result Date: 02/17/2020 CLINICAL DATA:  Chest pain radiating into both arms. EXAM: CHEST - 2 VIEW COMPARISON:  Chest radiograph and 12/10/2017 FINDINGS: The cardiomediastinal contours are normal. Atherosclerosis of the thoracic aorta. Pulmonary vasculature is normal. No consolidation, pleural effusion, or pneumothorax. No acute osseous abnormalities are seen. IMPRESSION: No acute chest findings. Aortic Atherosclerosis (ICD10-I70.0). Electronically Signed   By: Narda Rutherford M.D.   On: 02/17/2020  22:33   ECHOCARDIOGRAM COMPLETE  Result Date: 02/18/2020    ECHOCARDIOGRAM REPORT   Patient Name:   [name] Date of Exam: 02/18/2020 Medical Rec #:  [id number]         Height:       74.0 in Accession #:    [id number]        Weight:       261.4 lb Date of Birth:  Dec 22, 1950         BSA:          2.435 m Patient Age:    69 years          BP:           119/75 mmHg Patient Gender: M                 HR:           52 bpm. Exam Location:  Inpatient Procedure: 2D Echo, Cardiac Doppler, Color Doppler and Intracardiac            Opacification Agent Indications:    R07.9* Chest pain, unspecified; 121-121.4 ST elevation (STEMI)                 and non-ST elevation (NSTEMI) myocardial infarction  History:        Patient has prior history of Echocardiogram examinations, most                 recent 12/11/2017. Previous Myocardial Infarction and CAD,                 Abnormal ECG, Signs/Symptoms:Chest Pain; Risk                 Factors:Hypertension and Dyslipidemia. Covid positive.  Sonographer:    Sheralyn Boatman RDCS Referring Phys: [id number] [facility] MARTIN  Sonographer Comments: Technically difficult study due to poor echo windows. Image acquisition challenging due to patient body habitus. IMPRESSIONS  1. Left ventricular ejection fraction, by estimation, is 35 to 40%. The left ventricle has moderately decreased function. The left ventricle demonstrates regional wall motion abnormalities (see scoring diagram/findings for description). There is mild left ventricular hypertrophy. Left ventricular diastolic parameters are indeterminate.  2. Right ventricular systolic function is normal. The right ventricular size is normal. There is normal pulmonary artery systolic pressure.  3. The mitral valve is normal in structure. No evidence of mitral valve regurgitation.  4. The aortic valve is tricuspid. Aortic valve regurgitation is not visualized. No aortic stenosis is present.  5. Aortic dilatation noted. There is mild  dilatation of the ascending aorta, measuring 40 mm. FINDINGS  Left Ventricle: Left ventricular ejection fraction, by estimation, is 35 to 40%. The left ventricle has  moderately decreased function. The left ventricle demonstrates regional wall motion abnormalities. Definity contrast agent was given IV to delineate the left ventricular endocardial borders. The left ventricular internal cavity size was normal in size. There is mild left ventricular hypertrophy. Left ventricular diastolic parameters are indeterminate.  LV Wall Scoring: The mid and distal anterior septum, apical anterior segment, and apex are akinetic. The basal anteroseptal segment and mid anterior segment are hypokinetic. The entire lateral wall, entire inferior wall, mid inferoseptal segment, basal anterior segment, and basal inferoseptal segment are normal. Right Ventricle: The right ventricular size is normal. No increase in right ventricular wall thickness. Right ventricular systolic function is normal. There is normal pulmonary artery systolic pressure. The tricuspid regurgitant velocity is 2.15 m/s, and  with an assumed right atrial pressure of 3 mmHg, the estimated right ventricular systolic pressure is 21.5 mmHg. Left Atrium: Left atrial size was normal in size. Right Atrium: Right atrial size was normal in size. Pericardium: Trivial pericardial effusion is present. Mitral Valve: The mitral valve is normal in structure. No evidence of mitral valve regurgitation. Tricuspid Valve: The tricuspid valve is normal in structure. Tricuspid valve regurgitation is trivial. Aortic Valve: The aortic valve is tricuspid. Aortic valve regurgitation is not visualized. No aortic stenosis is present. Pulmonic Valve: The pulmonic valve was not well visualized. Pulmonic valve regurgitation is not visualized. Aorta: The aortic root is normal in size and structure and aortic dilatation noted. There is mild dilatation of the ascending aorta, measuring 40 mm.  IAS/Shunts: The interatrial septum was not well visualized.  LEFT VENTRICLE PLAX 2D LVIDd:         4.90 cm      Diastology LV PW:         1.10 cm      LV e' medial:    6.53 cm/s LV IVS:        1.10 cm      LV E/e' medial:  8.6 LVOT diam:     2.20 cm      LV e' lateral:   7.07 cm/s LV SV:         51           LV E/e' lateral: 7.9 LV SV Index:   21 LVOT Area:     3.80 cm  LV Volumes (MOD) LV vol d, MOD A2C: 149.0 ml LV vol d, MOD A4C: 121.5 ml LV vol s, MOD A2C: 88.5 ml LV vol s, MOD A4C: 80.0 ml LV SV MOD A2C:     60.5 ml LV SV MOD A4C:     121.5 ml LV SV MOD BP:      54.6 ml RIGHT VENTRICLE             IVC RV S prime:     10.00 cm/s  IVC diam: 1.90 cm TAPSE (M-mode): 2.0 cm LEFT ATRIUM             Index       RIGHT ATRIUM          Index LA diam:        3.75 cm 1.54 cm/m  RA Area:     9.03 cm LA Vol (A2C):   49.8 ml 20.45 ml/m RA Volume:   16.90 ml 6.94 ml/m LA Vol (A4C):   40.5 ml 16.63 ml/m LA Biplane Vol: 45.6 ml 18.72 ml/m  AORTIC VALVE LVOT Vmax:   61.10 cm/s LVOT Vmean:  49.100 cm/s LVOT VTI:    0.134 m  AORTA Ao Root diam: 3.50 cm Ao Asc diam:  4.00 cm MITRAL VALVE               TRICUSPID VALVE MV Area (PHT): 3.15 cm    TR Peak grad:   18.5 mmHg MV Decel Time: 241 msec    TR Vmax:        215.00 cm/s MV E velocity: 56.10 cm/s MV A velocity: 35.30 cm/s  SHUNTS MV E/A ratio:  1.59        Systemic VTI:  0.13 m                            Systemic Diam: 2.20 cm Christopher Schumann MD Electronically signed by Christopher Schumann MD Signature Date/Time: 02/18/2020/1:27:17 PM    Final     Cardiac Studies   Echocardiogram 02/18/20: 1. Left ventricular ejection fraction, by estimation, is 35 to 40%. The  left ventricle has moderately decreased function. The left ventricle  demonstrates regional wall motion abnormalities (see scoring  diagram/findings for description). There is mild  left ventricular hypertrophy. Left ventricular diastolic parameters are  indeterminate.  2. Right ventricular systolic  function is normal. The right ventricular  size is normal. There is normal pulmonary artery systolic pressure.  3. The mitral valve is normal in structure. No evidence of mitral valve  regurgitation.  4. The aortic valve is tricuspid. Aortic valve regurgitation is not  visualized. No aortic stenosis is present.  5. Aortic dilatation noted. There is mild dilatation of the ascending  aorta, measuring 40 mm.   Patient Profile     69 y.o. male with PMH of CAD (medically managed), HTN, and HLD, who presented with chest pain, found to have an NSTEMI.   Assessment & Plan    1. NSTEMI in patient with known CAD: patient presented with chest pain. He has known CAD with occluded LCx and moderate LAD and ramus disease on cath in 2019. HsTrop trend concerning for ACS: 796>861>1203>1147. EKG non-ischemic. He was started on heparin gtt and has remained chest pain free since admission. Echo showed EF 35-40% with anteroseptal/inferosptal/apical WMA, mild LVH, indeterminate LV diastolic function, no valvular abnormalities, and mild dilation of the ascending aorta (40mm). Decision made to pursue LHC - Await LHC today - CLD this morning, then keep NPO after 10am.  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 C/f ischemic etiology in light of recent chest pain and elevated troponins. Await LHC today. Appears well-compensated on exam. - Anticipate transition to metoprolol succinate prior to discharge - Anticipate addition of entresto prior to discharge if Cr is stable post LHC and there is room in BP - Will need repeat echo in 3 months to monitor for improvement in LV function  - Will monitor strict I&Os and daily weights while admitted  3. COVID-19: incidental finding on admission. He is vaccinated and boosted. Medicine is following to assist with management - appreciate  recommendations. Patient ordered for remdesivir. - Continue Remdesivir per IM  4. HTN: BP stable - Continue metoprolol for now  5. HLD: LDL 46 this admission; at goal of <70 - Continue atorvastatin  6. DM type 2: A1C 8.6 this admission. DM coordinator consulted and recommended addition of levemir 12units BID  - Will start levemir 12u BID today - Continue ISS - Would consider addition   of SGLT2-inhibitor in light of #2.   For questions or updates, please contact [facility] Please consult [email or web address] for contact info under   Chrystie NoseKenneth C. Saamir Armstrong, MD, Milagros LollFACC, FACP  [place]  [facility]  Medical Director of the [facility] Diplomate of the American Board of Clinical Lipidology Attending Cardiologist  Direct Dial: [phone]  Fax: [phone]  Website:  [email or web address]  Beatriz StallionKrista M. Kroeger, PA-C  02/19/2020, 9:12 AM

## 2020-02-19 NOTE — Interval H&P Note (Signed)
History and Physical Interval Note:  02/19/2020 4:54 PM  Aaron Summers  has presented today for surgery, with the diagnosis of NSTEMI.  The various methods of treatment have been discussed with the patient and family. After consideration of risks, benefits and other options for treatment, the patient has consented to  Procedure(s): LEFT HEART CATH AND CORONARY ANGIOGRAPHY (N/A) as a surgical intervention.  The patient's history has been reviewed, patient examined, no change in status, stable for surgery.  I have reviewed the patient's chart and labs.  Questions were answered to the patient's satisfaction.   Cath Lab Visit (complete for each Cath Lab visit)  Clinical Evaluation Leading to the Procedure:   ACS: Yes.    Non-ACS:    Anginal Classification: CCS IV  Anti-ischemic medical therapy: Maximal Therapy (2 or more classes of medications)  Non-Invasive Test Results: No non-invasive testing performed  Prior CABG: No previous CABG        Theron Arista [facility] 02/19/2020 4:54 PM

## 2020-02-19 NOTE — H&P (View-Only) (Signed)
Progress Note  Patient Name: Aaron Summers Date of Encounter: 02/19/2020  [facility] Cardiologist: Lyn Records III, MD   Subjective   No chest pain overnight. No shortness of breath. Echo yesterday shows LVEF depressed at 35-40% with mid to distal anteroseptal, apical anterior and apical hypokinesis. 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   Inpatient Medications    Scheduled Meds: . albuterol  2 puff Inhalation Q6H  . aspirin EC  81 mg Oral Daily  . atorvastatin  80 mg Oral Daily  . clopidogrel  75 mg Oral Q breakfast  . influenza vaccine adjuvanted  0.5 mL Intramuscular Tomorrow-1000  . insulin aspart  0-15 Units Subcutaneous TID WC  . insulin aspart  0-5 Units Subcutaneous QHS  . isosorbide mononitrate  30 mg Oral Daily  . metoprolol tartrate  50 mg Oral BID  . sodium chloride flush  3 mL Intravenous Q12H   Continuous Infusions: . sodium chloride    . sodium chloride    . heparin 1,900 Units/hr (02/19/20 0539)  . remdesivir 100 mg in NS 100 mL 100 mg (02/19/20 0856)   PRN Meds: sodium chloride, acetaminophen, ALPRAZolam, nitroGLYCERIN, ondansetron (ZOFRAN) IV, sodium chloride flush, zolpidem   Vital Signs    Vitals:   02/18/20 1504 02/18/20 1950 02/18/20 2330 02/19/20 0414  BP: 128/76 134/69 132/76 116/73  Pulse: 83 81 76 72  Resp: 18 19 (!) 21 16  Temp: 99 F (37.2 C) 98.7 F (37.1 C) 98.7 F (37.1 C) 98 F (36.7 C)  TempSrc: Oral Oral Oral Oral  SpO2: 95% 95% 93% 96%  Weight:      Height:        Intake/Output Summary (Last 24 hours) at 02/19/2020 0912 Last data filed at 02/19/2020 0300 Gross per 24 hour  Intake 458.85 ml  Output --  Net 458.85 ml   Last 3 Weights 02/18/2020 01/14/2020 03/05/2018  Weight (lbs) 261 lb 6.4 oz 264 lb 256 lb  Weight (kg) 118.57 kg  119.75 kg 116.121 kg      Telemetry    Sinus rhythm - Personally Reviewed  ECG    NSR at 65 with anterolateral TWI's - Personally Reviewed  Physical Exam   General appearance: alert and no distress Neck: no carotid bruit, no JVD and thyroid not enlarged, symmetric, no tenderness/mass/nodules Lungs: clear to auscultation bilaterally Heart: regular rate and rhythm Abdomen: soft, non-tender; bowel sounds normal; no masses,  no organomegaly Extremities: edema trace bilateral Pulses: 2+ and symmetric Skin: Skin color, texture, turgor normal. No rashes or lesions Neurologic: Grossly normal Psych: Pleasant   Labs    High Sensitivity Troponin:   Recent Labs  Lab 02/17/20 2219 02/17/20 2357 02/18/20 0400  TROPONINIHS 796* 861* 1,147*  1,203*      Chemistry Recent Labs  Lab 02/17/20 2219 02/18/20 0400  NA 133* 135  K 3.8 3.9  CL 101 102  CO2 23 21*  GLUCOSE 297* 220*  BUN 17 16  CREATININE 0.80 0.71  CALCIUM 9.3 9.1  GFRNONAA >60 >60  ANIONGAP 9 12     Hematology Recent Labs  Lab 02/17/20 2219 02/18/20 0400 02/19/20 0614  WBC 9.4 9.7 6.6  RBC 4.84 4.72 4.51  HGB 15.1 14.5 13.7  HCT 42.0 41.9 40.0  MCV 86.8  88.8 88.7  MCH 31.2 30.7 30.4  MCHC 36.0 34.6 34.3  RDW 12.2 12.1 12.2  PLT 243 227 182    BNPNo results for input(s): BNP, PROBNP in the last 168 hours.   DDimer  Recent Labs  Lab 02/18/20 0902  DDIMER <0.27     Radiology    DG Chest 2 View  Result Date: 02/17/2020 CLINICAL DATA:  Chest pain radiating into both arms. EXAM: CHEST - 2 VIEW COMPARISON:  Chest radiograph and 12/10/2017 FINDINGS: The cardiomediastinal contours are normal. Atherosclerosis of the thoracic aorta. Pulmonary vasculature is normal. No consolidation, pleural effusion, or pneumothorax. No acute osseous abnormalities are seen. IMPRESSION: No acute chest findings. Aortic Atherosclerosis (ICD10-I70.0). Electronically Signed   By: Narda Rutherford M.D.   On: 02/17/2020  22:33   ECHOCARDIOGRAM COMPLETE  Result Date: 02/18/2020    ECHOCARDIOGRAM REPORT   Patient Name:   Aaron Summers Date of Exam: 02/18/2020 Medical Rec #:  [id number]         Height:       74.0 in Accession #:    [id number]        Weight:       261.4 lb Date of Birth:  Dec 22, 1950         BSA:          2.435 m Patient Age:    69 years          BP:           119/75 mmHg Patient Gender: M                 HR:           52 bpm. Exam Location:  Inpatient Procedure: 2D Echo, Cardiac Doppler, Color Doppler and Intracardiac            Opacification Agent Indications:    R07.9* Chest pain, unspecified; 121-121.4 ST elevation (STEMI)                 and non-ST elevation (NSTEMI) myocardial infarction  History:        Patient has prior history of Echocardiogram examinations, most                 recent 12/11/2017. Previous Myocardial Infarction and CAD,                 Abnormal ECG, Signs/Symptoms:Chest Pain; Risk                 Factors:Hypertension and Dyslipidemia. Covid positive.  Sonographer:    Sheralyn Boatman RDCS Referring Phys: [id number] [facility] MARTIN  Sonographer Comments: Technically difficult study due to poor echo windows. Image acquisition challenging due to patient body habitus. IMPRESSIONS  1. Left ventricular ejection fraction, by estimation, is 35 to 40%. The left ventricle has moderately decreased function. The left ventricle demonstrates regional wall motion abnormalities (see scoring diagram/findings for description). There is mild left ventricular hypertrophy. Left ventricular diastolic parameters are indeterminate.  2. Right ventricular systolic function is normal. The right ventricular size is normal. There is normal pulmonary artery systolic pressure.  3. The mitral valve is normal in structure. No evidence of mitral valve regurgitation.  4. The aortic valve is tricuspid. Aortic valve regurgitation is not visualized. No aortic stenosis is present.  5. Aortic dilatation noted. There is mild  dilatation of the ascending aorta, measuring 40 mm. FINDINGS  Left Ventricle: Left ventricular ejection fraction, by estimation, is 35 to 40%. The left ventricle has  moderately decreased function. The left ventricle demonstrates regional wall motion abnormalities. Definity contrast agent was given IV to delineate the left ventricular endocardial borders. The left ventricular internal cavity size was normal in size. There is mild left ventricular hypertrophy. Left ventricular diastolic parameters are indeterminate.  LV Wall Scoring: The mid and distal anterior septum, apical anterior segment, and apex are akinetic. The basal anteroseptal segment and mid anterior segment are hypokinetic. The entire lateral wall, entire inferior wall, mid inferoseptal segment, basal anterior segment, and basal inferoseptal segment are normal. Right Ventricle: The right ventricular size is normal. No increase in right ventricular wall thickness. Right ventricular systolic function is normal. There is normal pulmonary artery systolic pressure. The tricuspid regurgitant velocity is 2.15 m/s, and  with an assumed right atrial pressure of 3 mmHg, the estimated right ventricular systolic pressure is 21.5 mmHg. Left Atrium: Left atrial size was normal in size. Right Atrium: Right atrial size was normal in size. Pericardium: Trivial pericardial effusion is present. Mitral Valve: The mitral valve is normal in structure. No evidence of mitral valve regurgitation. Tricuspid Valve: The tricuspid valve is normal in structure. Tricuspid valve regurgitation is trivial. Aortic Valve: The aortic valve is tricuspid. Aortic valve regurgitation is not visualized. No aortic stenosis is present. Pulmonic Valve: The pulmonic valve was not well visualized. Pulmonic valve regurgitation is not visualized. Aorta: The aortic root is normal in size and structure and aortic dilatation noted. There is mild dilatation of the ascending aorta, measuring 40 mm.  IAS/Shunts: The interatrial septum was not well visualized.  LEFT VENTRICLE PLAX 2D LVIDd:         4.90 cm      Diastology LV PW:         1.10 cm      LV e' medial:    6.53 cm/s LV IVS:        1.10 cm      LV E/e' medial:  8.6 LVOT diam:     2.20 cm      LV e' lateral:   7.07 cm/s LV SV:         51           LV E/e' lateral: 7.9 LV SV Index:   21 LVOT Area:     3.80 cm  LV Volumes (MOD) LV vol d, MOD A2C: 149.0 ml LV vol d, MOD A4C: 121.5 ml LV vol s, MOD A2C: 88.5 ml LV vol s, MOD A4C: 80.0 ml LV SV MOD A2C:     60.5 ml LV SV MOD A4C:     121.5 ml LV SV MOD BP:      54.6 ml RIGHT VENTRICLE             IVC RV S prime:     10.00 cm/s  IVC diam: 1.90 cm TAPSE (M-mode): 2.0 cm LEFT ATRIUM             Index       RIGHT ATRIUM          Index LA diam:        3.75 cm 1.54 cm/m  RA Area:     9.03 cm LA Vol (A2C):   49.8 ml 20.45 ml/m RA Volume:   16.90 ml 6.94 ml/m LA Vol (A4C):   40.5 ml 16.63 ml/m LA Biplane Vol: 45.6 ml 18.72 ml/m  AORTIC VALVE LVOT Vmax:   61.10 cm/s LVOT Vmean:  49.100 cm/s LVOT VTI:    0.134 m  AORTA Ao Root diam: 3.50 cm Ao Asc diam:  4.00 cm MITRAL VALVE               TRICUSPID VALVE MV Area (PHT): 3.15 cm    TR Peak grad:   18.5 mmHg MV Decel Time: 241 msec    TR Vmax:        215.00 cm/s MV E velocity: 56.10 cm/s MV A velocity: 35.30 cm/s  SHUNTS MV E/A ratio:  1.59        Systemic VTI:  0.13 m                            Systemic Diam: 2.20 cm Epifanio Lesches MD Electronically signed by Epifanio Lesches MD Signature Date/Time: 02/18/2020/1:27:17 PM    Final     Cardiac Studies   Echocardiogram 02/18/20: 1. Left ventricular ejection fraction, by estimation, is 35 to 40%. The  left ventricle has moderately decreased function. The left ventricle  demonstrates regional wall motion abnormalities (see scoring  diagram/findings for description). There is mild  left ventricular hypertrophy. Left ventricular diastolic parameters are  indeterminate.  2. Right ventricular systolic  function is normal. The right ventricular  size is normal. There is normal pulmonary artery systolic pressure.  3. The mitral valve is normal in structure. No evidence of mitral valve  regurgitation.  4. The aortic valve is tricuspid. Aortic valve regurgitation is not  visualized. No aortic stenosis is present.  5. Aortic dilatation noted. There is mild dilatation of the ascending  aorta, measuring 40 mm.   Patient Profile     70 y.o. male with PMH of CAD (medically managed), HTN, and HLD, who presented with chest pain, found to have an NSTEMI.   Assessment & Plan    1. NSTEMI in patient with known CAD: patient presented with chest pain. He has known CAD with occluded LCx and moderate LAD and ramus disease on cath in 2019. HsTrop trend concerning for ACS: [phone]. EKG non-ischemic. He was started on heparin gtt and has remained chest pain free since admission. Echo showed EF 35-40% with anteroseptal/inferosptal/apical WMA, mild LVH, indeterminate LV diastolic function, no valvular abnormalities, and mild dilation of the ascending aorta (40mm). Decision made to pursue LHC - Await LHC today - CLD this morning, then keep NPO after 10am.  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 C/f ischemic etiology in light of recent chest pain and elevated troponins. Await LHC today. Appears well-compensated on exam. - Anticipate transition to metoprolol succinate prior to discharge - Anticipate addition of entresto prior to discharge if Cr is stable post LHC and there is room in BP - Will need repeat echo in 3 months to monitor for improvement in LV function  - Will monitor strict I&Os and daily weights while admitted  3. COVID-19: incidental finding on admission. He is vaccinated and boosted. Medicine is following to assist with management - appreciate  recommendations. Patient ordered for remdesivir. - Continue Remdesivir per IM  4. HTN: BP stable - Continue metoprolol for now  5. HLD: LDL 46 this admission; at goal of <70 - Continue atorvastatin  6. DM type 2: A1C 8.6 this admission. DM coordinator consulted and recommended addition of levemir 12units BID  - Will start levemir 12u BID today - Continue ISS - Would consider addition  of SGLT2-inhibitor in light of #2.   For questions or updates, please contact [facility] Please consult [email or web address] for contact info under   Chrystie NoseKenneth C. Dilara Navarrete, MD, Milagros LollFACC, FACP  [place]  [facility]  Medical Director of the [facility]  [facility] Diplomate of the American Board of Clinical Lipidology Attending Cardiologist  Direct Dial: [phone]  Fax: [phone]  Website:  [email or web address]  Beatriz StallionKrista M. Kroeger, PA-C  02/19/2020, 9:12 AM

## 2020-02-20 ENCOUNTER — Encounter (HOSPITAL_COMMUNITY): Payer: Self-pay | Admitting: Cardiology

## 2020-02-20 LAB — CBC
HCT: 38.6 % — ABNORMAL LOW (ref 39.0–52.0)
Hemoglobin: 13.3 g/dL (ref 13.0–17.0)
MCH: 30.5 pg (ref 26.0–34.0)
MCHC: 34.5 g/dL (ref 30.0–36.0)
MCV: 88.5 fL (ref 80.0–100.0)
Platelets: 191 10*3/uL (ref 150–400)
RBC: 4.36 MIL/uL (ref 4.22–5.81)
RDW: 12.3 % (ref 11.5–15.5)
WBC: 7 10*3/uL (ref 4.0–10.5)
nRBC: 0 % (ref 0.0–0.2)

## 2020-02-20 LAB — BASIC METABOLIC PANEL
Anion gap: 8 (ref 5–15)
BUN: 11 mg/dL (ref 8–23)
CO2: 25 mmol/L (ref 22–32)
Calcium: 9.3 mg/dL (ref 8.9–10.3)
Chloride: 104 mmol/L (ref 98–111)
Creatinine, Ser: 0.78 mg/dL (ref 0.61–1.24)
GFR, Estimated: >60 mL/min (ref >60)
Glucose, Bld: 189 mg/dL — ABNORMAL HIGH (ref 70–99)
Potassium: 3.7 mmol/L (ref 3.5–5.1)
Sodium: 137 mmol/L (ref 135–145)

## 2020-02-20 LAB — GLUCOSE, CAPILLARY
Glucose-Capillary: 128 mg/dL — ABNORMAL HIGH (ref 70–99)
Glucose-Capillary: 101 mg/dL — ABNORMAL HIGH (ref 70–99)

## 2020-02-20 MED ORDER — METOPROLOL SUCCINATE ER 50 MG PO TB24: 50.0000 mg | ORAL_TABLET | Freq: Every day | ORAL | 6 refills | Status: DC

## 2020-02-20 MED ORDER — METOPROLOL SUCCINATE ER 50 MG PO TB24: 50.0000 mg | ORAL_TABLET | Freq: Every day | ORAL | Status: DC

## 2020-02-20 MED ORDER — SACUBITRIL-VALSARTAN 24-26 MG PO TABS: 1.0000 | ORAL_TABLET | Freq: Two times a day (BID) | ORAL | 6 refills | Status: DC

## 2020-02-20 MED ORDER — SACUBITRIL-VALSARTAN 24-26 MG PO TABS
1.0000 | ORAL_TABLET | Freq: Two times a day (BID) | ORAL | Status: DC
  Administered 2020-02-20: 1 via ORAL
  Filled 2020-02-20: qty 1

## 2020-02-20 MED ORDER — DAPAGLIFLOZIN PROPANEDIOL 10 MG PO TABS: 10.0000 mg | ORAL_TABLET | Freq: Every day | ORAL | 6 refills | Status: DC

## 2020-02-20 MED ORDER — METFORMIN HCL 500 MG PO TABS: 500.0000 mg | ORAL_TABLET | Freq: Two times a day (BID) | ORAL | 3 refills | Status: DC

## 2020-02-20 MED ORDER — NITROGLYCERIN 0.4 MG SL SUBL: 0.4000 mg | SUBLINGUAL_TABLET | SUBLINGUAL | 3 refills | Status: DC | PRN

## 2020-02-20 MED FILL — metFORMIN HCL 500 MG TABS: 500 | 30 days supply | Qty: 60 | Fill #0

## 2020-02-20 MED FILL — NITROGLYCERIN 0.4 MG TAB SL: 0.4 | 7 days supply | Qty: 25 | Fill #0

## 2020-02-20 MED FILL — METOPROLOL SUCCINATE ER 50: 50 | 30 days supply | Qty: 30 | Fill #0

## 2020-02-20 MED FILL — FARXIGA 10 MG TABLET: 10 | 30 days supply | Qty: 30 | Fill #0

## 2020-02-20 MED FILL — ENTRESTO 24 MG-26 MG TABLET: 24-26 | 30 days supply | Qty: 60 | Fill #0

## 2020-02-20 MED FILL — Nitroglycerin IV Soln 100 MCG/ML in D5W: INTRA_ARTERIAL | Qty: 10 | Status: AC

## 2020-02-20 NOTE — Progress Notes (Signed)
CARDIAC REHAB PHASE I   Stent/MI education completed via telephone due to COVID restrictions. Materials left with RN to give pt. Pt with materials at bedside at time of call. Pt educated on importance of ASA, Plavix, and NTG. Pt given MI book along with heart healthy and diabetic diets. Reviewed site care, restrictions, and exercise guidelines. Spoke about importance of daily weights. Will refer to CRP II [place].  [id number] Reynold Bowen, RN BSN 02/20/2020 11:13 AM

## 2020-02-20 NOTE — Care Management (Signed)
1211 02-20-20 Benefits check submitted for Valla Leaver and Farxiga. Case Manager will follow for cost. Graves-Bigelow, Lamar Laundry, RN,BSN Case Manager

## 2020-02-20 NOTE — Discharge Instructions (Signed)
Coronary Artery Disease, Male Coronary artery disease (CAD) is a condition in which the arteries that lead to the heart (coronary arteries) become narrow or blocked. The narrowing or blockage can lead to decreased blood flow to the heart. Prolonged reduced blood flow can cause a heart attack (myocardial infarction or MI). This condition may also be called coronary heart disease. Because CAD is the leading cause of death in men, it is important to understand what causes this condition and how it is treated. What are the causes? CAD is most often caused by atherosclerosis. This is the buildup of fat and cholesterol (plaque) on the inside of the arteries. Over time, the plaque may narrow or block the artery, reducing blood flow to the heart. Plaque can also become weak and break off within a coronary artery and cause a sudden blockage. Other less common causes of CAD include:  A blood clot or a piece of a blood clot or other substance that blocks the flow of blood in a coronary artery (embolism).  A tearing of the artery (spontaneous coronary artery dissection).  An enlargement of an artery (aneurysm).  Inflammation (vasculitis) in the artery wall.   What increases the risk? The following factors may make you more likely to develop this condition:  Age. Men over age 25 are at a greater risk of CAD.  Family history of CAD.  Gender. Men often develop CAD earlier in life than women.  High blood pressure (hypertension).  Diabetes.  High cholesterol levels.  Tobacco use.  Excessive alcohol use.  Lack of exercise.  A diet high in saturated and trans fats, such as fried food and processed meat. Other possible risk factors include:  High stress levels.  Depression.  Obesity.  Sleep apnea. What are the signs or symptoms? Many people do not have any symptoms during the early stages of CAD. As the condition progresses, symptoms may include:  Chest pain (angina). The pain  can: ? Feel like crushing or squeezing, or like a tightness, pressure, fullness, or heaviness in the chest. ? Last more than a few minutes or can stop and recur. The pain tends to get worse with exercise or stress and to fade with rest.  Pain in the arms, neck, jaw, ear, or back.  Unexplained heartburn or indigestion.  Shortness of breath.  Nausea or vomiting.  Sudden light-headedness.  Sudden cold sweats.  Fluttering or fast heartbeat (palpitations). How is this diagnosed? This condition is diagnosed based on:  Your family and medical history.  A physical exam.  Tests, including: ? A test to check the electrical signals in your heart (electrocardiogram). ? Exercise stress test. This looks for signs of blockage when the heart is stressed with exercise, such as running on a treadmill. ? Pharmacologic stress test. This test looks for signs of blockage when the heart is being stressed with a medicine. ? Blood tests. ? Coronary angiogram. This is a procedure to look at the coronary arteries to see if there is any blockage. During this test, a dye is injected into your arteries so they appear on an X-ray. ? Coronary artery CT scan. This CT scan helps detect calcium deposits in your coronary arteries. Calcium deposits are an indicator of CAD. ? A test that uses sound waves to take a picture of your heart (echocardiogram). ? Chest X-ray. How is this treated? This condition may be treated by:  Healthy lifestyle changes to reduce risk factors.  Medicines such as: ? Antiplatelet medicines and  blood-thinning medicines, such as aspirin. These help to prevent blood clots. ? Nitroglycerin. ? Blood pressure medicines. ? Cholesterol-lowering medicine.  Coronary angioplasty and stenting. During this procedure, a thin, flexible tube is inserted through a blood vessel and into a blocked artery. A balloon or similar device on the end of the tube is inflated to open up the artery. In some  cases, a small, mesh tube (stent) is inserted into the artery to keep it open.  Coronary artery bypass surgery. During this surgery, veins or arteries from other parts of the body are used to create a bypass around the blockage and allow blood to reach your heart. Follow these instructions at home: Medicines  Take over-the-counter and prescription medicines only as told by your health care provider.  Do not take the following medicines unless your health care provider approves: ? NSAIDs, such as ibuprofen, naproxen, or celecoxib. ? Vitamin supplements that contain vitamin A, vitamin E, or both. Lifestyle  Follow an exercise program approved by your health care provider. Aim for 150 minutes of moderate exercise or 75 minutes of vigorous exercise each week.  Maintain a healthy weight or lose weight as approved by your health care provider.  Learn to manage stress or try to limit your stress. Ask your health care provider for suggestions if you need help.  Get screened for depression and seek treatment, if needed.  Do not use any products that contain nicotine or tobacco, such as cigarettes, e-cigarettes, and chewing tobacco. If you need help quitting, ask your health care provider.  Do not use illegal drugs. Eating and drinking  Follow a heart-healthy diet. A dietitian can help educate you about healthy food options and changes. In general, eat plenty of fruits and vegetables, lean meats, and whole grains.  Avoid foods high in: ? Sugar. ? Salt (sodium). ? Saturated fat, such as processed or fatty meat. ? Trans fat, such as fried foods.  Use healthy cooking methods such as roasting, grilling, broiling, baking, poaching, steaming, or stir-frying.  Do not drink alcohol if your health care provider tells you not to drink.  If you drink alcohol: ? Limit how much you have to 0-2 drinks per day. ? Be aware of how much alcohol is in your drink. In the [place] one drink equals one 12 oz  bottle of beer (355 mL), one 5 oz glass of wine (148 mL), or one 1 oz glass of hard liquor (44 mL).   General instructions  Manage any other health conditions, such as hypertension and diabetes. These conditions affect your heart.  Your health care provider may ask you to monitor your blood pressure. Ideally, your blood pressure should be below 130/80.  Keep all follow-up visits as told by your health care provider. This is important. Get help right away if:  You have pain in your chest, neck, ear, arm, jaw, stomach, or back that: ? Lasts more than a few minutes. ? Is recurring. ? Is not relieved by taking medicine under your tongue (sublingual nitroglycerin).  You have profuse sweating without cause.  You have unexplained: ? Heartburn or indigestion. ? Shortness of breath or difficulty breathing. ? Fluttering or fast heartbeat (palpitations). ? Nausea or vomiting. ? Fatigue. ? Feelings of nervousness or anxiety. ? Weakness. ? Diarrhea.  You have sudden light-headedness or dizziness.  You faint.  You feel like hurting yourself or think about taking your own life. These symptoms may represent a serious problem that is an emergency. Do not wait  to see if the symptoms will go away. Get medical help right away. Call your local emergency services (911 in the [place] Do not drive yourself to the hospital. Summary  Coronary artery disease (CAD) is a condition in which the arteries that lead to the heart (coronary arteries) become narrow or blocked. The narrowing or blockage can lead to a heart attack.  Many people do not have any symptoms during the early stages of CAD.  CAD can be treated with lifestyle changes, medicines, surgery, or a combination of these treatments. This information is not intended to replace advice given to you by your health care provider. Make sure you discuss any questions you have with your health care provider. Document Revised: 09/07/2017 Document  Reviewed: 08/28/2017 Elsevier Patient Education  2021 [employer]. PLEASE REMEMBER TO BRING ALL OF YOUR MEDICATIONS TO EACH OF YOUR FOLLOW-UP OFFICE VISITS.  PLEASE ATTEND ALL SCHEDULED FOLLOW-UP APPOINTMENTS.   Activity: Increase activity slowly as tolerated. You may shower, but no soaking baths (or swimming) for 1 week. No driving for 24 hours. No lifting over 5 lbs for 1 week. No sexual activity for 1 week.   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   Wound Care: You may wash cath site gently with soap and water. Keep cath site clean and dry. If you notice pain, swelling, bleeding or pus at your cath site, please call [phone].   Heart Failure Education: 1. Weigh yourself EVERY morning after you go to the bathroom but before you eat or drink anything. Write this number down in a weight log/diary. If you gain 3 pounds overnight or 5 pounds in a week, call the office. 2. Take your medicines as prescribed. If you have concerns about your medications, please call us before you stop taking them.  3. Eat low salt foods--Limit salt (sodium) to 2000 mg per day. This will help prevent your body from holding onto fluid. Read food labels as many processed foods have a lot of sodium, especially canned goods and prepackaged meats. If you would like some assistance choosing low sodium foods, we would be happy to set you up with a nutritionist. 4. Stay as active as you can everyday. Staying active will give you more energy and make your muscles stronger. Start with 5 minutes at a time and work your way up to 30 minutes a day. Break up your activities--do some in the morning and some in the afternoon. Start with 3 days per week and work your way up to 5 days as you can.  If you have chest pain, feel short of breath, dizzy, or lightheaded, STOP. If you don't feel better after a short rest, call 911. If you do feel  better, call the office to let us know you have symptoms with exercise. 5. Limit all fluids for the day to less than 2 liters. Fluid includes all drinks, coffee, juice, ice chips, soup, jello, and all other liquids.   Medication changes: - Continue aspirin and plavix as prescribed - missing doses of this medications puts you at risk of forming a blockage in your stent - START entresto 24-26mg  - 1 tab 2 times daily - for heart failure - START metoprolol succinate 50mg  daily - this medication will replace metoprolol tartrate - for heart failure - START metformin 500mg  2 times daily - first dose 02/22/20 - for diabetes - START farxiga (  dapaglflozin propaneiol) 10mg  daily - for diabetes and heart failure - STOP amlodipine-valsartan - STOP metoprolol tartrate  COVID-19: you tested positive for COVID 19 02/17/20. You should continue to quarantine through 02/26/20. Please wear a mask if you need to interact with others.

## 2020-02-20 NOTE — Discharge Summary (Signed)
Discharge Summary    Patient ID: ZEN CEDILLOS MRN: [id number]; DOB: [birth year]  Admit date: 02/17/2020 Discharge date: 02/20/2020  PCP:  Kirby Funk, MD   [facility]  Cardiologist:  Garwin Brothers, MD  Advanced Practice Provider:  No care team member to display Electrophysiologist:  None  :[id number]}    Discharge Diagnoses    Principal Problem:   NSTEMI (non-ST elevated myocardial infarction) [facility]) Active Problems:   Essential hypertension   Mixed dyslipidemia   COVID-19   Class 1 obesity due to excess calories with body mass index (BMI) of 33.0 to 33.9 in adult   Uncontrolled type 2 diabetes mellitus with hyperglycemia (HCC)    Diagnostic Studies/Procedures    Echocardiogram 02/18/20: 1. Left ventricular ejection fraction, by estimation, is 35 to 40%. The  left ventricle has moderately decreased function. The left ventricle  demonstrates regional wall motion abnormalities (see scoring  diagram/findings for description). There is mild  left ventricular hypertrophy. Left ventricular diastolic parameters are  indeterminate.  2. Right ventricular systolic function is normal. The right ventricular  size is normal. There is normal pulmonary artery systolic pressure.  3. The mitral valve is normal in structure. No evidence of mitral valve  regurgitation.  4. The aortic valve is tricuspid. Aortic valve regurgitation is not  visualized. No aortic stenosis is present.  5. Aortic dilatation noted. There is mild dilatation of the ascending  aorta, measuring 40 mm.   Left heart catheterization 02/19/20:  Ost LAD to Prox LAD lesion is 90% stenosed.  A drug-eluting stent was successfully placed using a STENT RESOLUTE ONYX 4.0X15.  Post intervention, there is a 0% residual stenosis.  Mid LAD lesion is 50% stenosed.  Ost Cx to Prox Cx lesion is 100% stenosed.  Ramus lesion is 60% stenosed.  LV end diastolic pressure is mildly  elevated.  1. 2 vessel occlusive CAD - acute 90% ostial/proximal LAD with ulceration - 100% CTO of the LCx 2. Mildly elevated LVEDP 22 mm Hg 3. Successful PCI of the ostial/proximal LAD with DES x 1  Plan: DAPT for at least one year. Guideline directed medical therapy for reduced EF.  Diagnostic Dominance: Right    Intervention      _____________   History of Present Illness     Aaron Summers is a 70 y.o. male with a PMH of CAD (medically managed), HTN, and HLD, who presented with chest pain, found to have an NSTEMI.   Mr. Aaron Summers has h/o HTN, dyslipidemia and CAD with significant single-vessel LCx CAD that is being medically managed; he also had moderate disease in the LAD and ramus as well noted on most recent LHC in 2019. He has been having escalating anginal sx over the past week to 10 days. 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 It was a physically vigorous day and the pain did go away w/ NTG x 3 and rest.  He didn't have any discomfort for the rest of the week until the night of 02/17/20; he again required NTG x 3 for relief around dinner that evening. He describes his discomfort as a "pressure" discomfort that occurs in the center of his chest (midsternal area) when he exerts himself, lasts several minutes, and eventually goes away with rest. NTG SL also brings relief.  The chest discomfort is associated with radiation into both his arms as well as a  feeling of tightness in his neck. No diaphoresis, nausea, palpitations, SOB or other associated sx. Pt states these sx are similar to prior anginal sx; he has h/o NSTEMI back in 2019 prior to his last LHC.  He is currently pain-free.  Hospital Course     Consultants: DM coordinator   1. NSTEMI in patient with known CAD: patient presented with chest pain. He has known CAD with occluded LCx and moderate  LAD and ramus disease on cath in 2019. HsTrop trend concerning for ACS: [phone]. EKG non-ischemic. He was started on heparin gtt and has remained chest pain free since admission. Echo showed EF 35-40% with anteroseptal/inferosptal/apical WMA, mild LVH, indeterminate LV diastolic function, no valvular abnormalities, and mild dilation of the ascending aorta (40mm). Decision made to pursue LHC which showed 2 vessel occlusive CAD with 90% ostial-pLAD stenosis managed with PCI/DES and CTO of the LCx, he had dLAD stenosis of 50% and 60% ramus stenosis which was medically managed. He was recommended for ongoing DAPT with aspirin and clopidogrel uninterrupted for 1 year.  - Continue aspirin and plavix - Continue atorvastatin - Continue metoprolol and imdur - Rx for SL nitro renewed.   2. 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 C/f ischemic etiology in light of recent chest pain and elevated troponins. LHC as above with severe LAD stenosis managed with PCI/DES. Appears well-compensated on exam. He was transitioned to metoprolol succinate and started on entresto for management of his ICM.  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 Anticipate he will need a repeat echocardiogram in 3 months to re-evaluate LV function prior to returning to work. Patient was updated to these recommendations. Will defer to outpatient provider to finalize plan.   3. COVID-19: incidental finding on admission. He is vaccinated and boosted. Medicine is following to assist with management - appreciate recommendations. Patient received remdesivir this admission.  - Recommended to continue quarantining through 02/26/20  4. HTN: BP stable. He was transitioned to metoprolol succinate and  started on entresto as above. Home amlodipine-valsartan discontinued.  - Continue metoprolol for now  5. HLD: LDL 46 this admission; at goal of <70 - Continue atorvastatin  6. DM type 2: A1C 8.6 this admission. DM coordinator consulted and recommended addition of levemir 12units BID while admitted. He was transitioned to metformin 500mg  BID (to start 02/22/20) and farxiga 10mg  daily. Prior auth initiated for farxiga coverage this admission.  - Will need close PCP follow-up after discharge - Continue metformin and farxiga - Will need repeat A1C in 2-3 months per PCP   Did the patient have an acute coronary syndrome (MI, NSTEMI, STEMI, etc) this admission?:  Yes                               AHA/ACC Clinical Performance & Quality Measures: 1. Aspirin prescribed? - Yes 2. ADP Receptor Inhibitor (Plavix/Clopidogrel, Brilinta/Ticagrelor or Effient/Prasugrel) prescribed (includes medically managed patients)? - Yes 3. Beta Blocker prescribed? - Yes 4. High Intensity Statin (Lipitor 40-80mg  or Crestor 20-40mg ) prescribed? - Yes 5. EF assessed during THIS hospitalization? - Yes 6. For EF <40%, was ACEI/ARB prescribed? - Yes 7. For EF <40%, Aldosterone Antagonist (Spironolactone or Eplerenone) prescribed? - No - Reason:  can consider outpatient if Cr stable and room in BP 8. Cardiac Rehab Phase II ordered (including medically managed patients)? - Yes       _____________  Discharge Vitals Blood pressure 128/79, pulse 71, temperature (!) 97.5 F (36.4 C), temperature source Oral, resp. rate 20, height  (1.88 m), weight 116.8 kg, SpO2 98 %.  Filed Weights   02/18/20 0019 02/20/20 0541  Weight: 118.6 kg 116.8 kg    Labs & Radiologic Studies    CBC Recent Labs    02/19/20 0614 02/20/20 0153  WBC 6.6 7.0  HGB 13.7 13.3  HCT 40.0 38.6*  MCV 88.7 88.5  PLT 182 191   Basic Metabolic Panel Recent Labs    16/10/96 0400 02/19/20 0628 02/20/20 0153  NA 135  --  137  K 3.9  --   3.7  CL 102  --  104  CO2 21*  --  25  GLUCOSE 220*  --  189*  BUN 16  --  11  CREATININE 0.71 0.88 0.78  CALCIUM 9.1  --  9.3   Liver Function Tests No results for input(s): AST, ALT, ALKPHOS, BILITOT, PROT, ALBUMIN in the last 72 hours. No results for input(s): LIPASE, AMYLASE in the last 72 hours. High Sensitivity Troponin:   Recent Labs  Lab 02/17/20 2219 02/17/20 2357 02/18/20 0400  TROPONINIHS 796* 861* 1,147*  1,203*    BNP Invalid input(s): POCBNP D-Dimer Recent Labs    02/18/20 0902  DDIMER <0.27   Hemoglobin A1C Recent Labs    02/18/20 0400  HGBA1C 8.6*   Fasting Lipid Panel Recent Labs    02/18/20 0400  CHOL 108  HDL 25*  LDLCALC 46  TRIG 045*  CHOLHDL 4.3   Thyroid Function Tests Recent Labs    02/18/20 0400  TSH 4.920*   _____________  DG Chest 2 View  Result Date: 02/17/2020 CLINICAL DATA:  Chest pain radiating into both arms. EXAM: CHEST - 2 VIEW COMPARISON:  Chest radiograph and 12/10/2017 FINDINGS: The cardiomediastinal contours are normal. Atherosclerosis of the thoracic aorta. Pulmonary vasculature is normal. No consolidation, pleural effusion, or pneumothorax. No acute osseous abnormalities are seen. IMPRESSION: No acute chest findings. Aortic Atherosclerosis (ICD10-I70.0). Electronically Signed   By: Narda Rutherford M.D.   On: 02/17/2020 22:33   CARDIAC CATHETERIZATION  Result Date: 02/19/2020  Ost LAD to Prox LAD lesion is 90% stenosed.  A drug-eluting stent was successfully placed using a STENT RESOLUTE ONYX 4.0X15.  Post intervention, there is a 0% residual stenosis.  Mid LAD lesion is 50% stenosed.  Ost Cx to Prox Cx lesion is 100% stenosed.  Ramus lesion is 60% stenosed.  LV end diastolic pressure is mildly elevated.  1. 2 vessel occlusive CAD    - acute 90% ostial/proximal LAD with ulceration    - 100% CTO of the LCx 2. Mildly elevated LVEDP 22 mm Hg 3. Successful PCI of the ostial/proximal LAD with DES x 1 Plan: DAPT for at  least one year. Guideline directed medical therapy for reduced EF.   ECHOCARDIOGRAM COMPLETE  Result Date: 02/18/2020    ECHOCARDIOGRAM REPORT   Patient Name:   HUZAIFA VINEY Date of Exam: 02/18/2020 Medical Rec #:  [id number]         Height:       74.0 in Accession #:    [id number]        Weight:       261.4 lb Date of Birth:  1950/07/23  BSA:          2.435 m Patient Age:    69 years          BP:           119/75 mmHg Patient Gender: M                 HR:           52 bpm. Exam Location:  Inpatient Procedure: 2D Echo, Cardiac Doppler, Color Doppler and Intracardiac            Opacification Agent Indications:    R07.9* Chest pain, unspecified; 121-121.4 ST elevation (STEMI)                 and non-ST elevation (NSTEMI) myocardial infarction  History:        Patient has prior history of Echocardiogram examinations, most                 recent 12/11/2017. Previous Myocardial Infarction and CAD,                 Abnormal ECG, Signs/Symptoms:Chest Pain; Risk                 Factors:Hypertension and Dyslipidemia. Covid positive.  Sonographer:    Sheralyn Boatman RDCS Referring Phys: [id number] [facility] MARTIN  Sonographer Comments: Technically difficult study due to poor echo windows. Image acquisition challenging due to patient body habitus. IMPRESSIONS  1. Left ventricular ejection fraction, by estimation, is 35 to 40%. The left ventricle has moderately decreased function. The left ventricle demonstrates regional wall motion abnormalities (see scoring diagram/findings for description). There is mild left ventricular hypertrophy. Left ventricular diastolic parameters are indeterminate.  2. Right ventricular systolic function is normal. The right ventricular size is normal. There is normal pulmonary artery systolic pressure.  3. The mitral valve is normal in structure. No evidence of mitral valve regurgitation.  4. The aortic valve is tricuspid. Aortic valve regurgitation is not visualized. No aortic stenosis is  present.  5. Aortic dilatation noted. There is mild dilatation of the ascending aorta, measuring 40 mm. FINDINGS  Left Ventricle: Left ventricular ejection fraction, by estimation, is 35 to 40%. The left ventricle has moderately decreased function. The left ventricle demonstrates regional wall motion abnormalities. Definity contrast agent was given IV to delineate the left ventricular endocardial borders. The left ventricular internal cavity size was normal in size. There is mild left ventricular hypertrophy. Left ventricular diastolic parameters are indeterminate.  LV Wall Scoring: The mid and distal anterior septum, apical anterior segment, and apex are akinetic. The basal anteroseptal segment and mid anterior segment are hypokinetic. The entire lateral wall, entire inferior wall, mid inferoseptal segment, basal anterior segment, and basal inferoseptal segment are normal. Right Ventricle: The right ventricular size is normal. No increase in right ventricular wall thickness. Right ventricular systolic function is normal. There is normal pulmonary artery systolic pressure. The tricuspid regurgitant velocity is 2.15 m/s, and  with an assumed right atrial pressure of 3 mmHg, the estimated right ventricular systolic pressure is 21.5 mmHg. Left Atrium: Left atrial size was normal in size. Right Atrium: Right atrial size was normal in size. Pericardium: Trivial pericardial effusion is present. Mitral Valve: The mitral valve is normal in structure. No evidence of mitral valve regurgitation. Tricuspid Valve: The tricuspid valve is normal in structure. Tricuspid valve regurgitation is trivial. Aortic Valve: The aortic valve is tricuspid. Aortic valve regurgitation is not visualized. No aortic stenosis is present.  Pulmonic Valve: The pulmonic valve was not well visualized. Pulmonic valve regurgitation is not visualized. Aorta: The aortic root is normal in size and structure and aortic dilatation noted. There is mild  dilatation of the ascending aorta, measuring 40 mm. IAS/Shunts: The interatrial septum was not well visualized.  LEFT VENTRICLE PLAX 2D LVIDd:         4.90 cm      Diastology LV PW:         1.10 cm      LV e' medial:    6.53 cm/s LV IVS:        1.10 cm      LV E/e' medial:  8.6 LVOT diam:     2.20 cm      LV e' lateral:   7.07 cm/s LV SV:         51           LV E/e' lateral: 7.9 LV SV Index:   21 LVOT Area:     3.80 cm  LV Volumes (MOD) LV vol d, MOD A2C: 149.0 ml LV vol d, MOD A4C: 121.5 ml LV vol s, MOD A2C: 88.5 ml LV vol s, MOD A4C: 80.0 ml LV SV MOD A2C:     60.5 ml LV SV MOD A4C:     121.5 ml LV SV MOD BP:      54.6 ml RIGHT VENTRICLE             IVC RV S prime:     10.00 cm/s  IVC diam: 1.90 cm TAPSE (M-mode): 2.0 cm LEFT ATRIUM             Index       RIGHT ATRIUM          Index LA diam:        3.75 cm 1.54 cm/m  RA Area:     9.03 cm LA Vol (A2C):   49.8 ml 20.45 ml/m RA Volume:   16.90 ml 6.94 ml/m LA Vol (A4C):   40.5 ml 16.63 ml/m LA Biplane Vol: 45.6 ml 18.72 ml/m  AORTIC VALVE LVOT Vmax:   61.10 cm/s LVOT Vmean:  49.100 cm/s LVOT VTI:    0.134 m  AORTA Ao Root diam: 3.50 cm Ao Asc diam:  4.00 cm MITRAL VALVE               TRICUSPID VALVE MV Area (PHT): 3.15 cm    TR Peak grad:   18.5 mmHg MV Decel Time: 241 msec    TR Vmax:        215.00 cm/s MV E velocity: 56.10 cm/s MV A velocity: 35.30 cm/s  SHUNTS MV E/A ratio:  1.59        Systemic VTI:  0.13 m                            Systemic Diam: 2.20 cm Epifanio Lesches MD Electronically signed by Epifanio Lesches MD Signature Date/Time: 02/18/2020/1:27:17 PM    Final    Disposition   Pt is being discharged home today in good condition.  Follow-up Plans & Appointments     Follow-up Information    Kirby Funk, MD Follow up.   Specialty: Internal Medicine Why: Please call to arrange a follow-up appointment to be seen within 1-2 weeks of discharge.  Contact information: [address], [address] [place]  [address] [phone]        Tobb,  Kardie, DO Follow up on 03/04/2020.   Specialty: Cardiology Why: Please arrive 15 minutes early for your 10am post-hospital cardiology appointment. Dr. Tomie China did not have any open appointments so you have been scheduled to see one of his colleagues for close follow-up. Contact information: [address] [place] [address] [phone]              Discharge Instructions    Amb Referral to Cardiac Rehabilitation   Complete by: As directed    Will send Cardiac Rehab Phase 2 referral to [place]   Diagnosis:  Coronary Stents NSTEMI     After initial evaluation and assessments completed: Virtual Based Care may be provided alone or in conjunction with Phase 2 Cardiac Rehab based on patient barriers.: Yes      Discharge Medications   Allergies as of 02/20/2020   No Known Allergies     Medication List    STOP taking these medications   amLODipine-valsartan 10-320 MG tablet Commonly known as: EXFORGE   metoprolol tartrate 50 MG tablet Commonly known as: LOPRESSOR     TAKE these medications   aspirin EC 81 MG tablet Take 81 mg by mouth daily.   atorvastatin 80 MG tablet Commonly known as: LIPITOR Take 80 mg by mouth daily.   clopidogrel 75 MG tablet Commonly known as: PLAVIX Take 1 tablet (75 mg total) by mouth daily.   dapagliflozin propanediol 10 MG Tabs tablet Commonly known as: Farxiga Take 1 tablet (10 mg total) by mouth daily before breakfast.   isosorbide mononitrate 30 MG 24 hr tablet Commonly known as: IMDUR Take 1 tablet (30 mg total) by mouth daily. Patient needs appointment prior to any further refills. (2nd Attempt).   metFORMIN 500 MG tablet Commonly known as: Glucophage Take 1 tablet (500 mg total) by mouth 2 (two) times daily with a meal. Start taking on: February 22, 2020 Notes to patient: Start this medication 02/22/20   metoprolol succinate 50 MG 24 hr tablet Commonly known as: TOPROL-XL Take 1  tablet (50 mg total) by mouth daily. Take with or immediately following a meal. Start taking on: February 21, 2020   nitroGLYCERIN 0.4 MG SL tablet Commonly known as: NITROSTAT Place 1 tablet (0.4 mg total) under the tongue every 5 (five) minutes x 3 doses as needed for chest pain.   sacubitril-valsartan 24-26 MG Commonly known as: ENTRESTO Take 1 tablet by mouth 2 (two) times daily.          Outstanding Labs/Studies   Will need repeat BMET at follow-up for close monitoring of kidney function/electrolytes  Duration of Discharge Encounter   Greater than 30 minutes including physician time.  Signed, Beatriz Stallion, PA-C 02/20/2020, 11:53 AM

## 2020-02-20 NOTE — TOC Benefit Eligibility Note (Signed)
Transition of Care [facility]) Benefit Eligibility Note    Patient Details  Name: Aaron Summers MRN: [id number] Date of Birth: Oct 09, 1950   Medication/Dose: Delene Loll  24-26 MG BID CO-PAY- $70.00 , FARXIGA 5 MG DAILY CO-PAY $70.00 , FARXIGA  10 MG DAILY  CO-PAY- $70.00  Covered?: Yes  Tier: 2 Drug  Prescription Coverage [facility]  Spoke with Person/Company/Phone Number:: RENEE @ [facility] # [phone]  Co-Pay: $ 70.00  Prior Approval: No  Deductible: Met (OUT-OF-POCKET: UNMET)  Additional Notes: JARDIANCE  10 MG DAILY CO-PAY- $70.00    Memory Argue Phone Number: 02/20/2020, 2:03 PM

## 2020-02-20 NOTE — TOC Benefit Eligibility Note (Signed)
Patient Advocate Encounter  Prior Authorization for Farxiga 10 mg has been approved.    PA# [id number] Effective dates: 02/20/2020 through 02/19/2023  Patients co-pay is $15.00.     Roland Earl, CPhT Pharmacy Patient Advocate Specialist [place] Antimicrobial Stewardship Team Direct Number: [phone]  Fax: [phone]

## 2020-02-23 NOTE — Telephone Encounter (Signed)
Pt was seen in the ED and had a cath. Dr. Tomie China reviewed and stress test will not be needed.

## 2020-02-23 NOTE — Addendum Note (Signed)
Addended by: Eleonore Chiquito on: 02/23/2020 08:32 AM   Modules accepted: Orders

## 2020-02-26 ENCOUNTER — Telehealth (HOSPITAL_COMMUNITY): Payer: Self-pay

## 2020-02-26 NOTE — Telephone Encounter (Signed)
Cardiac rehab referral for Ph.II faxed to [place]. 

## 2020-03-02 ENCOUNTER — Telehealth: Payer: Self-pay | Admitting: Cardiology

## 2020-03-02 DIAGNOSIS — Z0279 Encounter for issue of other medical certificate: Secondary | ICD-10-CM

## 2020-03-02 NOTE — Telephone Encounter (Signed)
Forms from [employer]Geovonni Gahan received on 03/02/2020. Completed patient authorization attached. Took form to Revankar's box for completion.  kbl 03/02/2020

## 2020-03-03 ENCOUNTER — Other Ambulatory Visit: Payer: Self-pay

## 2020-03-03 DIAGNOSIS — I255 Ischemic cardiomyopathy: Secondary | ICD-10-CM

## 2020-03-03 DIAGNOSIS — E1169 Type 2 diabetes mellitus with other specified complication: Secondary | ICD-10-CM | POA: Insufficient documentation

## 2020-03-03 DIAGNOSIS — I252 Old myocardial infarction: Secondary | ICD-10-CM | POA: Insufficient documentation

## 2020-03-03 DIAGNOSIS — Z86010 Personal history of colonic polyps: Secondary | ICD-10-CM

## 2020-03-03 DIAGNOSIS — Z860101 Personal history of adenomatous and serrated colon polyps: Secondary | ICD-10-CM

## 2020-03-03 DIAGNOSIS — R7301 Impaired fasting glucose: Secondary | ICD-10-CM

## 2020-03-03 HISTORY — DX: Impaired fasting glucose: R73.01

## 2020-03-03 HISTORY — DX: Type 2 diabetes mellitus with other specified complication: E11.69

## 2020-03-03 HISTORY — DX: Personal history of colonic polyps: Z86.010

## 2020-03-03 HISTORY — DX: Ischemic cardiomyopathy: I25.5

## 2020-03-03 HISTORY — DX: Old myocardial infarction: I25.2

## 2020-03-03 HISTORY — DX: Personal history of adenomatous and serrated colon polyps: Z86.0101

## 2020-03-04 ENCOUNTER — Encounter: Payer: Self-pay | Admitting: Cardiology

## 2020-03-04 ENCOUNTER — Other Ambulatory Visit: Payer: Self-pay

## 2020-03-04 ENCOUNTER — Ambulatory Visit: Payer: BLUE CROSS/BLUE SHIELD | Admitting: Cardiology

## 2020-03-04 VITALS — BP 130/72 | HR 80 | Ht 74.0 in | Wt 251.8 lb

## 2020-03-04 DIAGNOSIS — E669 Obesity, unspecified: Secondary | ICD-10-CM

## 2020-03-04 DIAGNOSIS — I10 Essential (primary) hypertension: Secondary | ICD-10-CM | POA: Diagnosis not present

## 2020-03-04 DIAGNOSIS — I255 Ischemic cardiomyopathy: Secondary | ICD-10-CM

## 2020-03-04 DIAGNOSIS — I2510 Atherosclerotic heart disease of native coronary artery without angina pectoris: Secondary | ICD-10-CM | POA: Diagnosis not present

## 2020-03-04 DIAGNOSIS — E782 Mixed hyperlipidemia: Secondary | ICD-10-CM

## 2020-03-04 DIAGNOSIS — E1165 Type 2 diabetes mellitus with hyperglycemia: Secondary | ICD-10-CM

## 2020-03-04 LAB — BASIC METABOLIC PANEL
BUN/Creatinine Ratio: 12 (ref 10–24)
BUN: 10 mg/dL (ref 8–27)
CO2: 19 mmol/L — ABNORMAL LOW (ref 20–29)
Calcium: 9.9 mg/dL (ref 8.6–10.2)
Chloride: 103 mmol/L (ref 96–106)
Creatinine, Ser: 0.81 mg/dL (ref 0.76–1.27)
Glucose: 117 mg/dL — ABNORMAL HIGH (ref 65–99)
Potassium: 4.2 mmol/L (ref 3.5–5.2)
Sodium: 138 mmol/L (ref 134–144)
eGFR: 96 mL/min/{1.73_m2} (ref >59)

## 2020-03-04 LAB — MAGNESIUM: Magnesium: 2.1 mg/dL (ref 1.6–2.3)

## 2020-03-04 MED ORDER — SPIRONOLACTONE 25 MG PO TABS: 12.5000 mg | ORAL_TABLET | Freq: Every day | ORAL | 3 refills | Status: DC

## 2020-03-04 NOTE — Progress Notes (Signed)
Cardiology Office Note:    Date:  03/04/2020   ID:  Aaron BlazeWilliam E Debenedetto, DOB 07/29/1950, MRN [id number]  PCP:  [name], [name], MD  Cardiologist:  Garwin Brothersajan R Revankar, MD  Electrophysiologist:  None   Referring MD: [name], [name], MD     History of Present Illness:    Aaron Summers is a 70 y.o. male with a hx of non-ST elevation myocardial infarction s/p PCI for was recently hospitalized [facility] discharged on February 20, 2020, hypertension, ischemic cardiomyopathy recent EF 35 to 40% hyperlipidemia, stroke, COVID-19 infection, obesity and uncontrolled diabetes.  He is here today for follow-up visit.  He is very happy with his care that he [facility].  Since his discharge he was able to contact cardiac rehab and is planning on starting next week.  He denies any chest pain or shortness of breath.  Past Medical History:  Diagnosis Date  . CAD    a. LHC 12/11/2017: Ost Cx to Prox Cx 100% stenosed with large ramus branch supplying lateral wall, Ost LAD to Prox LAD 40% stenosed, mid LAD 50% stenosed, Ramus 70% stenosed  . Essential hypertension 03/05/2018  . Mixed dyslipidemia 03/05/2018  . NSTEMI (non-ST elevated myocardial infarction) (HCC) 12/10/2017    Past Surgical History:  Procedure Laterality Date  . CORONARY STENT INTERVENTION N/A 02/19/2020   Procedure: CORONARY STENT INTERVENTION;  Surgeon: SwazilandJordan, Peter M, MD;  [facility];  Service: Cardiovascular;  Laterality: N/A;  . FOREIGN BODY REMOVAL Left 05/16/2012   Procedure: FOREIGN BODY REMOVAL LEFT THUMB;  Surgeon: Tami RibasKevin R Kuzma, MD;  Location: [facility];  Service: Orthopedics;  Laterality: Left;  . FRACTURE SURGERY    . INGUINAL HERNIA REPAIR Left 1954  . LEFT HEART CATH AND CORONARY ANGIOGRAPHY N/A 12/11/2017   Procedure: LEFT HEART CATH AND CORONARY ANGIOGRAPHY;  Surgeon: SwazilandJordan, Peter M, MD;  [facility];  Service: Cardiovascular;  Laterality: N/A;   . LEFT HEART CATH AND CORONARY ANGIOGRAPHY N/A 02/19/2020   Procedure: LEFT HEART CATH AND CORONARY ANGIOGRAPHY;  Surgeon: SwazilandJordan, Peter M, MD;  [facility];  Service: Cardiovascular;  Laterality: N/A;  . ORIF CLAVICULAR FRACTURE Right 1971    Current Medications: Current Meds  Medication Sig  . aspirin EC 81 MG tablet Take 81 mg by mouth daily.  Marland Kitchen. atorvastatin (LIPITOR) 80 MG tablet Take 80 mg by mouth daily.  . clopidogrel (PLAVIX) 75 MG tablet Take 1 tablet (75 mg total) by mouth daily.  . dapagliflozin propanediol (FARXIGA) 10 MG TABS tablet Take 1 tablet (10 mg total) by mouth daily before breakfast.  . metFORMIN (GLUCOPHAGE) 500 MG tablet Take 1 tablet (500 mg total) by mouth 2 (two) times daily with a meal.  . metoprolol succinate (TOPROL-XL) 50 MG 24 hr tablet Take 1 tablet (50 mg total) by mouth daily. Take with or immediately following a meal.  . nitroGLYCERIN (NITROSTAT) 0.4 MG SL tablet Place 1 tablet (0.4 mg total) under the tongue every 5 (five) minutes x 3 doses as needed for chest pain.  . sacubitril-valsartan (ENTRESTO) 24-26 MG Take 1 tablet by mouth 2 (two) times daily.  Marland Kitchen. spironolactone (ALDACTONE) 25 MG tablet Take 0.5 tablets (12.5 mg total) by mouth daily.  . [DISCONTINUED] amLODipine-valsartan (EXFORGE) 10-320 MG tablet Take 1 tablet by mouth daily.  . [DISCONTINUED] isosorbide mononitrate (IMDUR) 30 MG 24 hr tablet Take 1 tablet (30 mg total) by mouth daily. Patient needs  appointment prior to any further refills. (2nd Attempt).  . [DISCONTINUED] metoprolol tartrate (LOPRESSOR) 50 MG tablet Take 50 mg by mouth 2 (two) times daily.     Allergies:   Atorvastatin and Rosuvastatin calcium   Social History   Socioeconomic History  . Marital status: Married    Spouse name: Not on file  . Number of children: Not on file  . Years of education: Not on file  . Highest education level: Not on file  Occupational History  . Not on file  Tobacco Use  .  Smoking status: Never Smoker  . Smokeless tobacco: Never Used  Vaping Use  . Vaping Use: Never used  Substance and Sexual Activity  . Alcohol use: Never  . Drug use: No  . Sexual activity: Yes  Other Topics Concern  . Not on file  Social History Narrative  . Not on file   Social Determinants of Health   Financial Resource Strain: Not on file  Food Insecurity: Not on file  Transportation Needs: Not on file  Physical Activity: Not on file  Stress: Not on file  Social Connections: Not on file     Family History: The patient's family history includes Aortic aneurysm in his mother; Cancer in his maternal grandfather; Heart disease in his father.  ROS:   Review of Systems  Constitution: Negative for decreased appetite, fever and weight gain.  HENT: Negative for congestion, ear discharge, hoarse voice and sore throat.   Eyes: Negative for discharge, redness, vision loss in right eye and visual halos.  Cardiovascular: Negative for chest pain, dyspnea on exertion, leg swelling, orthopnea and palpitations.  Respiratory: Negative for cough, hemoptysis, shortness of breath and snoring.   Endocrine: Negative for heat intolerance and polyphagia.  Hematologic/Lymphatic: Negative for bleeding problem. Does not bruise/bleed easily.  Skin: Negative for flushing, nail changes, rash and suspicious lesions.  Musculoskeletal: Negative for arthritis, joint pain, muscle cramps, myalgias, neck pain and stiffness.  Gastrointestinal: Negative for abdominal pain, bowel incontinence, diarrhea and excessive appetite.  Genitourinary: Negative for decreased libido, genital sores and incomplete emptying.  Neurological: Negative for brief paralysis, focal weakness, headaches and loss of balance.  Psychiatric/Behavioral: Negative for altered mental status, depression and suicidal ideas.  Allergic/Immunologic: Negative for HIV exposure and persistent infections.    EKGs/Labs/Other Studies Reviewed:    The  following studies were reviewed today:   EKG:  The ekg ordered today demonstrates   Echocardiogram 02/18/20: 1. Left ventricular ejection fraction, by estimation, is 35 to 40%. The  left ventricle has moderately decreased function. The left ventricle  demonstrates regional wall motion abnormalities (see scoring  diagram/findings for description). There is mild  left ventricular hypertrophy. Left ventricular diastolic parameters are  indeterminate.  2. Right ventricular systolic function is normal. The right ventricular  size is normal. There is normal pulmonary artery systolic pressure.  3. The mitral valve is normal in structure. No evidence of mitral valve  regurgitation.  4. The aortic valve is tricuspid. Aortic valve regurgitation is not  visualized. No aortic stenosis is present.  5. Aortic dilatation noted. There is mild dilatation of the ascending  aorta, measuring 40 mm.   Left heart catheterization 02/19/20:  Ost LAD to Prox LAD lesion is 90% stenosed.  A drug-eluting stent was successfully placed using a STENT RESOLUTE ONYX 4.0X15.  Post intervention, there is a 0% residual stenosis.  Mid LAD lesion is 50% stenosed.  Ost Cx to Prox Cx lesion is 100% stenosed.  Ramus lesion  is 60% stenosed.  LV end diastolic pressure is mildly elevated.  1. 2 vessel occlusive CAD - acute 90% ostial/proximal LAD with ulceration - 100% CTO of the LCx 2. Mildly elevated LVEDP 22 mm Hg 3. Successful PCI of the ostial/proximal LAD with DES x 1  Plan: DAPT for at least one year. Guideline directed medical therapy for reduced EF.  Recent Labs: 02/18/2020: TSH 4.920 02/20/2020: BUN 11; Creatinine, Ser 0.78; Hemoglobin 13.3; Platelets 191; Potassium 3.7; Sodium 137  Recent Lipid Panel    Component Value Date/Time   CHOL 108 02/18/2020 0400   CHOL 112 03/05/2018 1009   TRIG 187 (H) 02/18/2020 0400   HDL 25 (L) 02/18/2020 0400   HDL 30 (L) 03/05/2018 1009   CHOLHDL 4.3  02/18/2020 0400   VLDL 37 02/18/2020 0400   LDLCALC 46 02/18/2020 0400   LDLCALC 54 03/05/2018 1009    Physical Exam:    VS:  BP 130/72   Pulse 80   Ht 6\' 2"  (1.88 m)   Wt 251 lb 12.8 oz (114.2 kg)   SpO2 98%   BMI 32.33 kg/m     Wt Readings from Last 3 Encounters:  03/04/20 251 lb 12.8 oz (114.2 kg)  02/20/20 257 lb 8 oz (116.8 kg)  01/14/20 264 lb (119.7 kg)     GEN: Well nourished, well developed in no acute distress HEENT: Normal NECK: No JVD; No carotid bruits LYMPHATICS: No lymphadenopathy CARDIAC: S1S2 noted,RRR, no murmurs, rubs, gallops RESPIRATORY:  Clear to auscultation without rales, wheezing or rhonchi  ABDOMEN: Soft, non-tender, non-distended, +bowel sounds, no guarding. EXTREMITIES: No edema, No cyanosis, no clubbing MUSCULOSKELETAL:  No deformity  SKIN: Warm and dry NEUROLOGIC:  Alert and oriented x 3, non-focal PSYCHIATRIC:  Normal affect, good insight  ASSESSMENT:    1. Coronary artery disease involving native coronary artery of native heart, unspecified whether angina present   2. Ischemic cardiomyopathy   3. Essential hypertension   4. Uncontrolled type 2 diabetes mellitus with hyperglycemia (HCC)   5. Mixed dyslipidemia   6. Obesity (BMI 30-39.9)    PLAN:     1.  He has no anginal symptoms.  I would like to continue patient on his current aspirin and Plavix for his dual antiplatelet therapy as well as his atorvastatin.  Since he has had his PCI I am going to stop the antianginal to see if he experiences any symptoms and any need for additional antianginal medication.  In that case the patient will prefer being off Imdur for other options such as Ranexa.  2.  In terms of his ischemic cardiomyopathy he has been placed on Entresto, Farxiga and Toprol-XL.  His blood pressure is acceptable today and his kidney function has been stable so what I like to do is add Aldactone 12.5 mg to his regimen.  He will need a repeat echocardiogram in about 90  days.  3.  Blood pressure is acceptable, continue with current antihypertensive regimen and will monitor him closely on the dishing of his 12.5 mg of Aldactone.  4.  This is being managed by his primary care doctor.  No adjustments for antidiabetic medications were made today.  5.  Hyperlipidemia - continue with current statin medication.  6.  Insert obesity   The patient is in agreement with the above plan. The patient left the office in stable condition.  The patient will follow up in   Medication Adjustments/Labs and Tests Ordered: Current medicines are reviewed at length with the  patient today.  Concerns regarding medicines are outlined above.  Orders Placed This Encounter  Procedures  . Basic metabolic panel  . Magnesium   Meds ordered this encounter  Medications  . spironolactone (ALDACTONE) 25 MG tablet    Sig: Take 0.5 tablets (12.5 mg total) by mouth daily.    Dispense:  45 tablet    Refill:  3    Patient Instructions  Medication Instructions:  Your physician has recommended you make the following change in your medication:  STOP: Imdur START: Aldactone 12.5 mg daily *If you need a refill on your cardiac medications before your next appointment, please call your pharmacy*   Lab Work: Your physician recommends that you return for lab work: TODAY: BMET, Mag If you have labs (blood work) drawn today and your tests are completely normal, you will receive your results only by: Marland Kitchen MyChart Message (if you have MyChart) OR . A paper copy in the mail If you have any lab test that is abnormal or we need to change your treatment, we will call you to review the results.   Testing/Procedures: None   Follow-Up: At [facility], you and your health needs are our priority.  As part of our continuing mission to provide you with exceptional heart care, we have created designated Provider Care Teams.  These Care Teams include your primary Cardiologist (physician) and  Advanced Practice Providers (APPs -  Physician Assistants and Nurse Practitioners) who all work together to provide you with the care you need, when you need it.  We recommend signing up for the patient portal called "MyChart".  Sign up information is provided on this After Visit Summary.  MyChart is used to connect with patients for Virtual Visits (Telemedicine).  Patients are able to view lab/test results, encounter notes, upcoming appointments, etc.  Non-urgent messages can be sent to your provider as well.   To learn more about what you can do with MyChart, go to [email or web address].    Your next appointment:   3 month(s)  The format for your next appointment:   In Person  Provider:   Belva Crome, MD   Other Instructions      Adopting a Healthy Lifestyle.  Know what a healthy weight is for you (roughly BMI <25) and aim to maintain this   Aim for 7+ servings of fruits and vegetables daily   65-80+ fluid ounces of water or unsweet tea for healthy kidneys   Limit to max 1 drink of alcohol per day; avoid smoking/tobacco   Limit animal fats in diet for cholesterol and heart health - choose grass fed whenever available   Avoid highly processed foods, and foods high in saturated/trans fats   Aim for low stress - take time to unwind and care for your mental health   Aim for 150 min of moderate intensity exercise weekly for heart health, and weights twice weekly for bone health   Aim for 7-9 hours of sleep daily   When it comes to diets, agreement about the perfect plan isnt easy to find, even among the experts. Experts at the [facility] developed an idea known as the Healthy Eating Plate. Just imagine a plate divided into logical, healthy portions.   The emphasis is on diet quality:   Load up on vegetables and fruits - one-half of your plate: Aim for color and variety, and remember that potatoes dont count.   Go for whole grains - one-quarter  of your plate: Whole  wheat, barley, wheat berries, quinoa, oats, brown rice, and foods made with them. If you want pasta, go with whole wheat pasta.   Protein power - one-quarter of your plate: Fish, chicken, beans, and nuts are all healthy, versatile protein sources. Limit red meat.   The diet, however, does go beyond the plate, offering a few other suggestions.   Use healthy plant oils, such as olive, canola, soy, corn, sunflower and peanut. Check the labels, and avoid partially hydrogenated oil, which have unhealthy trans fats.   If youre thirsty, drink water. Coffee and tea are good in moderation, but skip sugary drinks and limit milk and dairy products to one or two daily servings.   The type of carbohydrate in the diet is more important than the amount. Some sources of carbohydrates, such as vegetables, fruits, whole grains, and beans-are healthier than others.   Finally, stay active  Signed, Thomasene Ripple, DO  03/04/2020 10:26 AM    [facility]

## 2020-03-04 NOTE — Patient Instructions (Signed)
Medication Instructions:  Your physician has recommended you make the following change in your medication:  STOP: Imdur START: Aldactone 12.5 mg daily *If you need a refill on your cardiac medications before your next appointment, please call your pharmacy*   Lab Work: Your physician recommends that you return for lab work: TODAY: BMET, Mag If you have labs (blood work) drawn today and your tests are completely normal, you will receive your results only by: Marland Kitchen MyChart Message (if you have MyChart) OR . A paper copy in the mail If you have any lab test that is abnormal or we need to change your treatment, we will call you to review the results.   Testing/Procedures: None   Follow-Up: At [facility], you and your health needs are our priority.  As part of our continuing mission to provide you with exceptional heart care, we have created designated Provider Care Teams.  These Care Teams include your primary Cardiologist (physician) and Advanced Practice Providers (APPs -  Physician Assistants and Nurse Practitioners) who all work together to provide you with the care you need, when you need it.  We recommend signing up for the patient portal called "MyChart".  Sign up information is provided on this After Visit Summary.  MyChart is used to connect with patients for Virtual Visits (Telemedicine).  Patients are able to view lab/test results, encounter notes, upcoming appointments, etc.  Non-urgent messages can be sent to your provider as well.   To learn more about what you can do with MyChart, go to [email or web address].    Your next appointment:   3 month(s)  The format for your next appointment:   In Person  Provider:   Belva Crome, MD   Other Instructions

## 2020-03-09 NOTE — Telephone Encounter (Signed)
Forms completed, scanned into chart, scanned to email, and picked up per pt request. 03/09/2020  kbl

## 2020-03-15 ENCOUNTER — Other Ambulatory Visit: Payer: Self-pay | Admitting: Cardiology

## 2020-03-15 NOTE — Telephone Encounter (Signed)
Plavix approved and sent 

## 2020-03-17 ENCOUNTER — Telehealth: Payer: Self-pay | Admitting: Cardiology

## 2020-03-17 DIAGNOSIS — I209 Angina pectoris, unspecified: Secondary | ICD-10-CM

## 2020-03-17 NOTE — Telephone Encounter (Signed)
Patient wanted to know what Dr. Tomie China recommends in regards to returning to work

## 2020-03-18 NOTE — Telephone Encounter (Signed)
For question like this it always helps to know what is the precise nature of the work the patient does.  I also want to know whether he is walking on a regular basis and the details of his exercise at the current time. Thanks.  Please get back to me with this information.

## 2020-03-18 NOTE — Telephone Encounter (Signed)
   Shanda Bumps with [employer], pt's employer called. She said the short term disabilities submitted doesn't have a return to work date so when pt saw Dr. Servando Salina last 03/03 they ended his short term disability that day. She wanted to make sure pt will get his benefits on a timely manner. 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 The only time he needs to walk is when going [place] and [place] the plain.

## 2020-03-23 NOTE — Telephone Encounter (Signed)
Pt states that he would like to have a stress test and that he will pay out of pocket if needed but he would like to get one done. Pt states that he is doing great at cardiac rehab. How do you advise?

## 2020-03-23 NOTE — Telephone Encounter (Signed)
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

## 2020-03-24 NOTE — Telephone Encounter (Signed)
Called and left a message for [place].

## 2020-03-25 NOTE — Telephone Encounter (Signed)
Shanda Bumps calling back. 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 She states she is unsure if a normal EKG is required to be cleared to fly. She states it is okay to fax or email it. Fax: [phone], [email or web address]

## 2020-03-25 NOTE — Telephone Encounter (Signed)
I would like to get an exercise stress echo before I clear him.  Can be done at [facility].  Please tell me when you schedule you so I can talk to the doctor who is doing the test.

## 2020-03-26 NOTE — Telephone Encounter (Signed)
Pt is scheduled for stress echo at [facility] 03/31/20 at 8:00. Pt is aware.

## 2020-03-26 NOTE — Addendum Note (Signed)
Addended by: Eleonore Chiquito on: 03/26/2020 08:35 AM   Modules accepted: Orders

## 2020-03-29 ENCOUNTER — Telehealth: Payer: Self-pay | Admitting: Cardiology

## 2020-03-29 NOTE — Telephone Encounter (Signed)
Pt would like to know if he needs another covid test before his procedure on Wednesday.  [phone]  Thank you!

## 2020-03-29 NOTE — Telephone Encounter (Signed)
Pt has been advised that he has to have a COVID test prior to the stress echo. Pt verbalized understanding and had no questions.

## 2020-03-31 ENCOUNTER — Encounter: Payer: Self-pay | Admitting: Cardiology

## 2020-03-31 DIAGNOSIS — I2510 Atherosclerotic heart disease of native coronary artery without angina pectoris: Secondary | ICD-10-CM | POA: Diagnosis not present

## 2020-04-13 ENCOUNTER — Ambulatory Visit (INDEPENDENT_AMBULATORY_CARE_PROVIDER_SITE_OTHER): Payer: BLUE CROSS/BLUE SHIELD | Admitting: Cardiology

## 2020-04-13 ENCOUNTER — Encounter: Payer: Self-pay | Admitting: Cardiology

## 2020-04-13 ENCOUNTER — Other Ambulatory Visit: Payer: Self-pay

## 2020-04-13 VITALS — BP 156/78 | HR 82 | Ht 74.0 in | Wt 244.0 lb

## 2020-04-13 DIAGNOSIS — E1169 Type 2 diabetes mellitus with other specified complication: Secondary | ICD-10-CM

## 2020-04-13 DIAGNOSIS — I2510 Atherosclerotic heart disease of native coronary artery without angina pectoris: Secondary | ICD-10-CM | POA: Diagnosis not present

## 2020-04-13 DIAGNOSIS — E782 Mixed hyperlipidemia: Secondary | ICD-10-CM | POA: Diagnosis not present

## 2020-04-13 DIAGNOSIS — I10 Essential (primary) hypertension: Secondary | ICD-10-CM | POA: Diagnosis not present

## 2020-04-13 NOTE — Progress Notes (Signed)
Cardiology Office Note:    Date:  04/13/2020   ID:  [name], DOB 08/20/1950, MRN [id number]  PCP:  Kirby Funk, MD  Cardiologist:  Garwin Brothers, MD   Referring MD: Kirby Funk, MD    ASSESSMENT:    1. Coronary artery disease involving native coronary artery of native heart without angina pectoris   2. Primary hypertension   3. Mixed hyperlipidemia   4. Mixed dyslipidemia    PLAN:    In order of problems listed above:  1. Coronary artery disease: Secondary prevention stressed with the patient.  Importance of compliance with diet medication stressed and vocalized understanding.  Excellent effort tolerance and is pushing himself to more endurance. 2. Essential hypertension: Blood pressure stable and diet was emphasized. 3. Ischemic cardiomyopathy: Echocardiogram done at [facility] reviewed ejection fraction normal and documentation is much improved and exercise. 4. Mixed dyslipidemia: Lipids were reviewed diet was emphasized and is happy about it. 5. He will be seen in follow-up appointment in 4 months or earlier if he has any concerns.  I will evaluate him in early May to assess him to get back to work.  Very likely he should be able to get back to work at that point in time.  He flies a Education officer, community.   Medication Adjustments/Labs and Tests Ordered: Current medicines are reviewed at length with the patient today.  Concerns regarding medicines are outlined above.  No orders of the defined types were placed in this encounter.  No orders of the defined types were placed in this encounter.    No chief complaint on file.    History of Present Illness:    Aaron Summers is a 70 y.o. male.  Patient has past medical history of coronary artery disease post stenting, essential hypertension dyslipidemia and history of ischemic cardiomyopathy.  He is doing well at this time and is enrolled in cardiac program and diet well.  No chest pain orthopnea or  PND.  At the time of my evaluation, the patient is alert awake oriented and in no distress.  He is progressing well with his exercise at the cardiac rehab without any symptoms in his last stress echo revealed him to exercise was 6 minutes on the treadmill without any symptoms.  At the time of my evaluation, the patient is alert awake oriented and in no distress.  He is progressing over the next days of cardiac rehab.  Past Medical History:  Diagnosis Date  . Angina pectoris (HCC) 12/10/2017  . CAD    a. LHC 12/11/2017: Ost Cx to Prox Cx 100% stenosed with large ramus branch supplying lateral wall, Ost LAD to Prox LAD 40% stenosed, mid LAD 50% stenosed, Ramus 70% stenosed  . CAD (coronary artery disease) 12/12/2017  . COVID-19   . Essential hypertension 03/05/2018  . History of adenomatous polyp of colon 03/03/2020  . History of myocardial infarction 03/03/2020  . HTN (hypertension) 12/10/2017  . Hyperlipidemia   . Hypertension   . Impaired fasting glucose 03/03/2020  . Ischemic cardiomyopathy 03/03/2020  . Mixed dyslipidemia 03/05/2018  . Mixed hyperlipidemia 12/10/2017  . NSTEMI (non-ST elevated myocardial infarction) (HCC) 12/10/2017  . Obesity 02/18/2020  . Type 2 diabetes mellitus with other specified complication (HCC) 03/03/2020  . Uncontrolled type 2 diabetes mellitus with hyperglycemia [facility])     Past Surgical History:  Procedure Laterality Date  . CORONARY STENT INTERVENTION N/A 02/19/2020   Procedure: CORONARY STENT INTERVENTION;  Surgeon: Swaziland, Peter M, MD;  Location: [facility] INVASIVE CV LAB;  Service: Cardiovascular;  Laterality: N/A;  . FOREIGN BODY REMOVAL Left 05/16/2012   Procedure: FOREIGN BODY REMOVAL LEFT THUMB;  Surgeon: Tami Ribas, MD;  Location: [facility];  Service: Orthopedics;  Laterality: Left;  . FRACTURE SURGERY    . INGUINAL HERNIA REPAIR Left 1954  . LEFT HEART CATH AND CORONARY ANGIOGRAPHY N/A 12/11/2017   Procedure: LEFT HEART CATH AND CORONARY ANGIOGRAPHY;   Surgeon: Swaziland, Peter M, MD;  Location: [facility] INVASIVE CV LAB;  Service: Cardiovascular;  Laterality: N/A;  . LEFT HEART CATH AND CORONARY ANGIOGRAPHY N/A 02/19/2020   Procedure: LEFT HEART CATH AND CORONARY ANGIOGRAPHY;  Surgeon: Swaziland, Peter M, MD;  Location: [facility] INVASIVE CV LAB;  Service: Cardiovascular;  Laterality: N/A;  . ORIF CLAVICULAR FRACTURE Right 1971    Current Medications: Current Meds  Medication Sig  . aspirin EC 81 MG tablet Take 81 mg by mouth daily.  Marland Kitchen atorvastatin (LIPITOR) 80 MG tablet Take 80 mg by mouth daily.  . clopidogrel (PLAVIX) 75 MG tablet Take 75 mg by mouth daily.  . dapagliflozin propanediol (FARXIGA) 10 MG TABS tablet Take 1 tablet (10 mg total) by mouth daily before breakfast.  . metFORMIN (GLUCOPHAGE) 500 MG tablet Take 1 tablet (500 mg total) by mouth 2 (two) times daily with a meal.  . metoprolol succinate (TOPROL-XL) 50 MG 24 hr tablet Take 1 tablet (50 mg total) by mouth daily. Take with or immediately following a meal.  . nitroGLYCERIN (NITROSTAT) 0.4 MG SL tablet Place 1 tablet (0.4 mg total) under the tongue every 5 (five) minutes x 3 doses as needed for chest pain.  . sacubitril-valsartan (ENTRESTO) 24-26 MG Take 1 tablet by mouth 2 (two) times daily.     Allergies:   Atorvastatin and Rosuvastatin calcium   Social History   Socioeconomic History  . Marital status: Married    Spouse name: Not on file  . Number of children: Not on file  . Years of education: Not on file  . Highest education level: Not on file  Occupational History  . Not on file  Tobacco Use  . Smoking status: Never Smoker  . Smokeless tobacco: Never Used  Vaping Use  . Vaping Use: Never used  Substance and Sexual Activity  . Alcohol use: Never  . Drug use: No  . Sexual activity: Yes  Other Topics Concern  . Not on file  Social History Narrative  . Not on file   Social Determinants of Health   Financial Resource Strain: Not on file  Food Insecurity: Not on file   Transportation Needs: Not on file  Physical Activity: Not on file  Stress: Not on file  Social Connections: Not on file     Family History: The patient's family history includes Aortic aneurysm in his mother; Cancer in his maternal grandfather; Heart disease in his father.  ROS:   Please see the history of present illness.    All other systems reviewed and are negative.  EKGs/Labs/Other Studies Reviewed:    The following studies were reviewed today: CORONARY STENT INTERVENTION  LEFT HEART CATH AND CORONARY ANGIOGRAPHY    Conclusion    Ost LAD to Prox LAD lesion is 90% stenosed.  A drug-eluting stent was successfully placed using a STENT RESOLUTE ONYX 4.0X15.  Post intervention, there is a 0% residual stenosis.  Mid LAD lesion is 50% stenosed.  Ost Cx to Prox Cx lesion is 100% stenosed.  Ramus lesion is 60%  stenosed.  LV end diastolic pressure is mildly elevated.   1. 2 vessel occlusive CAD    - acute 90% ostial/proximal LAD with ulceration    - 100% CTO of the LCx 2. Mildly elevated LVEDP 22 mm Hg 3. Successful PCI of the ostial/proximal LAD with DES x 1  Plan: DAPT for at least one year. Guideline directed medical therapy for reduced EF.    Recent Labs: 02/18/2020: TSH 4.920 02/20/2020: Hemoglobin 13.3; Platelets 191 03/04/2020: BUN 10; Creatinine, Ser 0.81; Magnesium 2.1; Potassium 4.2; Sodium 138  Recent Lipid Panel    Component Value Date/Time   CHOL 108 02/18/2020 0400   CHOL 112 03/05/2018 1009   TRIG 187 (H) 02/18/2020 0400   HDL 25 (L) 02/18/2020 0400   HDL 30 (L) 03/05/2018 1009   CHOLHDL 4.3 02/18/2020 0400   VLDL 37 02/18/2020 0400   LDLCALC 46 02/18/2020 0400   LDLCALC 54 03/05/2018 1009    Physical Exam:    VS:  BP (!) 156/78   Pulse 82   Ht 6\' 2"  (1.88 m)   Wt 244 lb (110.7 kg)   SpO2 96%   BMI 31.33 kg/m      Wt Readings from Last 3 Encounters:  04/13/20 244 lb (110.7 kg)  03/04/20 251 lb 12.8 oz (114.2 kg)  02/20/20 257  lb 8 oz (116.8 kg)     GEN: Patient is in no acute distress HEENT: Normal NECK: No JVD; No carotid bruits LYMPHATICS: No lymphadenopathy CARDIAC: Hear sounds regular, 2/6 systolic murmur at the apex. RESPIRATORY:  Clear to auscultation without rales, wheezing or rhonchi  ABDOMEN: Soft, non-tender, non-distended MUSCULOSKELETAL:  No edema; No deformity  SKIN: Warm and dry NEUROLOGIC:  Alert and oriented x 3 PSYCHIATRIC:  Normal affect   Signed, 02/22/20, MD  04/13/2020 4:14 PM    [facility]

## 2020-04-13 NOTE — Patient Instructions (Signed)

## 2020-04-26 ENCOUNTER — Ambulatory Visit: Payer: BLUE CROSS/BLUE SHIELD | Admitting: Cardiology

## 2020-05-05 ENCOUNTER — Telehealth: Payer: Self-pay | Admitting: Cardiology

## 2020-05-05 NOTE — Telephone Encounter (Signed)
Checking on DOT paperwork, please call [phone] with an update.  Thank you!   Domingo Dimes

## 2020-05-07 NOTE — Telephone Encounter (Signed)
Informed patient that he will need to come by the office and sign a release of records in order for the records to be sent for his paperwork.  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

## 2020-05-07 NOTE — Telephone Encounter (Signed)
The paperwork has been sent to medical records.  They are requesting copies of all his recent testings in order for him to go back to work.  He is a Occupational hygienist.

## 2020-05-07 NOTE — Telephone Encounter (Signed)
Pt is calling back in regards to DOT paperwork, he wants to pick the paperwork up and send it certified mail to [place] today. Please advise

## 2020-06-08 ENCOUNTER — Other Ambulatory Visit: Payer: Self-pay | Admitting: Medical

## 2020-06-08 DIAGNOSIS — I2510 Atherosclerotic heart disease of native coronary artery without angina pectoris: Secondary | ICD-10-CM

## 2020-06-08 DIAGNOSIS — I10 Essential (primary) hypertension: Secondary | ICD-10-CM

## 2020-06-08 DIAGNOSIS — E782 Mixed hyperlipidemia: Secondary | ICD-10-CM

## 2020-06-10 ENCOUNTER — Other Ambulatory Visit: Payer: Self-pay | Admitting: Medical

## 2020-06-10 NOTE — Telephone Encounter (Signed)
This is a [place] pt °

## 2020-07-28 ENCOUNTER — Telehealth: Payer: Self-pay | Admitting: Cardiology

## 2020-07-28 NOTE — Telephone Encounter (Signed)
FYI--Patient states he received paperwork in the mail regarding his DOT Physical and what is needed. He states they are needing additional information, which has been specified in the paperwork. He states he plans in drop off the form at the office today, hoping it can be completed by the time of his appointment on 08/13/20.

## 2020-08-12 ENCOUNTER — Other Ambulatory Visit: Payer: Self-pay

## 2020-08-13 ENCOUNTER — Encounter: Payer: Self-pay | Admitting: Cardiology

## 2020-08-13 ENCOUNTER — Other Ambulatory Visit: Payer: Self-pay

## 2020-08-13 ENCOUNTER — Ambulatory Visit (INDEPENDENT_AMBULATORY_CARE_PROVIDER_SITE_OTHER): Payer: BLUE CROSS/BLUE SHIELD | Admitting: Cardiology

## 2020-08-13 VITALS — BP 128/80 | HR 86 | Ht 74.0 in | Wt 236.6 lb

## 2020-08-13 DIAGNOSIS — I77810 Thoracic aortic ectasia: Secondary | ICD-10-CM

## 2020-08-13 DIAGNOSIS — I10 Essential (primary) hypertension: Secondary | ICD-10-CM

## 2020-08-13 DIAGNOSIS — I2510 Atherosclerotic heart disease of native coronary artery without angina pectoris: Secondary | ICD-10-CM

## 2020-08-13 DIAGNOSIS — I255 Ischemic cardiomyopathy: Secondary | ICD-10-CM | POA: Diagnosis not present

## 2020-08-13 DIAGNOSIS — E782 Mixed hyperlipidemia: Secondary | ICD-10-CM

## 2020-08-13 HISTORY — DX: Thoracic aortic ectasia: I77.810

## 2020-08-13 NOTE — Progress Notes (Signed)
Cardiology Office Note:    Date:  08/13/2020   ID:  Aaron Summers, DOB 1950/10/22, MRN [id number]  PCP:  Aaron Funk, MD  Cardiologist:  Aaron Brothers, MD   Referring MD: Aaron Funk, MD    ASSESSMENT:    1. Coronary artery disease involving native coronary artery of native heart without angina pectoris   2. Essential hypertension   3. Mixed hyperlipidemia   4. Ischemic cardiomyopathy   5. Mixed dyslipidemia   6. Ascending aorta dilation (HCC)   7. Ascending aorta dilatation (HCC)    PLAN:    In order of problems listed above:  Coronary artery disease: Secondary prevention stressed with the patient.  Importance of compliance with diet medication stressed and he vocalized understanding.  He is having excellent effort tolerance and continues to go to cardiac rehab. Essential hypertension: Blood pressure stable and diet was emphasized.  Lifestyle modification urged.  His blood pressures at home are excellent when he brought any record of them. Mixed dyslipidemia: Diet was emphasized.  Weight reduction stressed risks of obesity explained and will have complete blood work in the next few days. Ascending aortic dilatation: Stable and will have a CT scan of the chest to review and monitor this. Patient will be seen in follow-up appointment in 6 months or earlier if the patient has any concerns    Medication Adjustments/Labs and Tests Ordered: Current medicines are reviewed at length with the patient today.  Concerns regarding medicines are outlined above.  Orders Placed This Encounter  Procedures   CT CHEST WO CONTRAST   Basic metabolic panel   Hepatic function panel   Lipid panel    No orders of the defined types were placed in this encounter.    No chief complaint on file.    History of Present Illness:    Aaron Summers is a 70 y.o. male.  Patient has past medical history of coronary artery disease post stenting, essential hypertension dyslipidemia and  diabetes mellitus.  She denies any problems at this time and takes care of activities of daily living.  No chest pain orthopnea or PND.  At the time of my evaluation, the patient is alert awake oriented and in no distress.  Past Medical History:  Diagnosis Date   Angina pectoris (HCC) 12/10/2017   CAD    a. LHC 12/11/2017: Ost Cx to Prox Cx 100% stenosed with large ramus branch supplying lateral wall, Ost LAD to Prox LAD 40% stenosed, mid LAD 50% stenosed, Ramus 70% stenosed   CAD (coronary artery disease) 12/12/2017   COVID-19    Essential hypertension 03/05/2018   History of adenomatous polyp of colon 03/03/2020   History of myocardial infarction 03/03/2020   HTN (hypertension) 12/10/2017   Hyperlipidemia    Hypertension    Impaired fasting glucose 03/03/2020   Ischemic cardiomyopathy 03/03/2020   Mixed dyslipidemia 03/05/2018   Mixed hyperlipidemia 12/10/2017   NSTEMI (non-ST elevated myocardial infarction) (HCC) 12/10/2017   Obesity 02/18/2020   Type 2 diabetes mellitus with other specified complication (HCC) 03/03/2020   Uncontrolled type 2 diabetes mellitus with hyperglycemia [facility])     Past Surgical History:  Procedure Laterality Date   CORONARY STENT INTERVENTION N/A 02/19/2020   Procedure: CORONARY STENT INTERVENTION;  Surgeon: Swaziland, Peter M, MD;  Location: [facility] INVASIVE CV LAB;  Service: Cardiovascular;  Laterality: N/A;   FOREIGN BODY REMOVAL Left 05/16/2012   Procedure: FOREIGN BODY REMOVAL LEFT THUMB;  Surgeon: Tami Ribas, MD;  Location: [facility];  Service: Orthopedics;  Laterality: Left;   FRACTURE SURGERY     INGUINAL HERNIA REPAIR Left 1954   LEFT HEART CATH AND CORONARY ANGIOGRAPHY N/A 12/11/2017   Procedure: LEFT HEART CATH AND CORONARY ANGIOGRAPHY;  Surgeon: Swaziland, Peter M, MD;  Location[facility] INVASIVE CV LAB;  Service: Cardiovascular;  Laterality: N/A;   LEFT HEART CATH AND CORONARY ANGIOGRAPHY N/A 02/19/2020   Procedure: LEFT HEART CATH AND CORONARY ANGIOGRAPHY;   Surgeon: Swaziland, Peter M, MD;  Location: [facility] INVASIVE CV LAB;  Service: Cardiovascular;  Laterality: N/A;   ORIF CLAVICULAR FRACTURE Right 1971    Current Medications: Current Meds  Medication Sig   aspirin EC 81 MG tablet Take 81 mg by mouth daily.   atorvastatin (LIPITOR) 80 MG tablet Take 80 mg by mouth daily.   clopidogrel (PLAVIX) 75 MG tablet Take 75 mg by mouth daily.   dapagliflozin propanediol (FARXIGA) 10 MG TABS tablet Take 1 tablet (10 mg total) by mouth daily before breakfast.   metFORMIN (GLUCOPHAGE) 500 MG tablet TAKE 1 TABLET BY MOUTH TWICE DAILY   metoprolol succinate (TOPROL-XL) 50 MG 24 hr tablet Take 1 tablet (50 mg total) by mouth daily. Take with or immediately following a meal.   nitroGLYCERIN (NITROSTAT) 0.4 MG SL tablet Place 0.4 mg under the tongue every 5 (five) minutes as needed for chest pain.   sacubitril-valsartan (ENTRESTO) 24-26 MG Take 1 tablet by mouth 2 (two) times daily.   spironolactone (ALDACTONE) 25 MG tablet Take 12.5 mg by mouth daily.     Allergies:   Atorvastatin and Rosuvastatin calcium   Social History   Socioeconomic History   Marital status: Married    Spouse name: Not on file   Number of children: Not on file   Years of education: Not on file   Highest education level: Not on file  Occupational History   Not on file  Tobacco Use   Smoking status: Never   Smokeless tobacco: Never  Vaping Use   Vaping Use: Never used  Substance and Sexual Activity   Alcohol use: Never   Drug use: No   Sexual activity: Yes  Other Topics Concern   Not on file  Social History Narrative   Not on file   Social Determinants of Health   Financial Resource Strain: Not on file  Food Insecurity: Not on file  Transportation Needs: Not on file  Physical Activity: Not on file  Stress: Not on file  Social Connections: Not on file     Family History: The patient's family history includes Aortic aneurysm in his mother; Cancer in his maternal  grandfather; Heart disease in his father.  ROS:   Please see the history of present illness.    All other systems reviewed and are negative.  EKGs/Labs/Other Studies Reviewed:    The following studies were reviewed today:   1. Left ventricular ejection fraction, by estimation, is 35 to 40%. The  left ventricle has moderately decreased function. The left ventricle  demonstrates regional wall motion abnormalities (see scoring  diagram/findings for description). There is mild  left ventricular hypertrophy. Left ventricular diastolic parameters are  indeterminate.   2. Right ventricular systolic function is normal. The right ventricular  size is normal. There is normal pulmonary artery systolic pressure.   3. The mitral valve is normal in structure. No evidence of mitral valve  regurgitation.   4. The aortic valve is tricuspid. Aortic valve regurgitation is not  visualized. No aortic stenosis  is present.   5. Aortic dilatation noted. There is mild dilatation of the ascending  aorta, measuring 40 mm.    Recent Labs: 02/18/2020: TSH 4.920 02/20/2020: Hemoglobin 13.3; Platelets 191 03/04/2020: BUN 10; Creatinine, Ser 0.81; Magnesium 2.1; Potassium 4.2; Sodium 138  Recent Lipid Panel    Component Value Date/Time   CHOL 108 02/18/2020 0400   CHOL 112 03/05/2018 1009   TRIG 187 (H) 02/18/2020 0400   HDL 25 (L) 02/18/2020 0400   HDL 30 (L) 03/05/2018 1009   CHOLHDL 4.3 02/18/2020 0400   VLDL 37 02/18/2020 0400   LDLCALC 46 02/18/2020 0400   LDLCALC 54 03/05/2018 1009    Physical Exam:    VS:  BP (!) 158/80 (BP Location: Right Arm, Patient Position: Sitting)   Pulse 86   Ht 6\' 2"  (1.88 m)   Wt 236 lb 9.6 oz (107.3 kg)   SpO2 98%   BMI 30.38 kg/m     Wt Readings from Last 3 Encounters:  08/13/20 236 lb 9.6 oz (107.3 kg)  04/13/20 244 lb (110.7 kg)  03/04/20 251 lb 12.8 oz (114.2 kg)     GEN: Patient is in no acute distress HEENT: Normal NECK: No JVD; No carotid  bruits LYMPHATICS: No lymphadenopathy CARDIAC: Hear sounds regular, 2/6 systolic murmur at the apex. RESPIRATORY:  Clear to auscultation without rales, wheezing or rhonchi  ABDOMEN: Soft, non-tender, non-distended MUSCULOSKELETAL:  No edema; No deformity  SKIN: Warm and dry NEUROLOGIC:  Alert and oriented x 3 PSYCHIATRIC:  Normal affect   Signed, 05/04/20, MD  08/13/2020 5:02 PM    [facility]

## 2020-08-13 NOTE — Patient Instructions (Addendum)
Medication Instructions:  No medication changes. *If you need a refill on your cardiac medications before your next appointment, please call your pharmacy*   Lab Work: Your physician recommends that you return for lab work in: the next few days. You need to have labs done when you are fasting.  You can come Monday through Friday 8:30 am to 12:00 pm and 1:15 to 4:30. You do not need to make an appointment as the order has already been placed. The labs you are going to have done are BMET, LFT and Lipids.  If you have labs (blood work) drawn today and your tests are completely normal, you will receive your results only by: MyChart Message (if you have MyChart) OR A paper copy in the mail If you have any lab test that is abnormal or we need to change your treatment, we will call you to review the results.   Testing/Procedures: Non-Cardiac CT Angiography (CTA), is a special type of CT scan that uses a computer to produce multi-dimensional views of major blood vessels throughout the body. In CT angiography, a contrast material is injected through an IV to help visualize the blood vessels    Follow-Up: At [facility], you and your health needs are our priority.  As part of our continuing mission to provide you with exceptional heart care, we have created designated Provider Care Teams.  These Care Teams include your primary Cardiologist (physician) and Advanced Practice Providers (APPs -  Physician Assistants and Nurse Practitioners) who all work together to provide you with the care you need, when you need it.  We recommend signing up for the patient portal called "MyChart".  Sign up information is provided on this After Visit Summary.  MyChart is used to connect with patients for Virtual Visits (Telemedicine).  Patients are able to view lab/test results, encounter notes, upcoming appointments, etc.  Non-urgent messages can be sent to your provider as well.   To learn more about what you can do  with MyChart, go to [email or web address].    Your next appointment:   6 month(s)  The format for your next appointment:   In Person  Provider:   Belva Crome, MD   Other Instructions NA

## 2020-08-16 LAB — BASIC METABOLIC PANEL
BUN/Creatinine Ratio: 13 (ref 10–24)
BUN: 9 mg/dL (ref 8–27)
CO2: 19 mmol/L — ABNORMAL LOW (ref 20–29)
Calcium: 9.3 mg/dL (ref 8.6–10.2)
Chloride: 104 mmol/L (ref 96–106)
Creatinine, Ser: 0.68 mg/dL — ABNORMAL LOW (ref 0.76–1.27)
Glucose: 121 mg/dL — ABNORMAL HIGH (ref 65–99)
Potassium: 4.2 mmol/L (ref 3.5–5.2)
Sodium: 139 mmol/L (ref 134–144)
eGFR: 101 mL/min/{1.73_m2} (ref >59)

## 2020-08-16 LAB — HEPATIC FUNCTION PANEL
ALT: 25 IU/L (ref 0–44)
AST: 26 IU/L (ref 0–40)
Albumin: 4.3 g/dL (ref 3.8–4.8)
Alkaline Phosphatase: 51 IU/L (ref 44–121)
Bilirubin Total: 0.6 mg/dL (ref 0.0–1.2)
Bilirubin, Direct: 0.17 mg/dL (ref 0.00–0.40)
Total Protein: 6.8 g/dL (ref 6.0–8.5)

## 2020-08-16 LAB — LIPID PANEL
Chol/HDL Ratio: 3.3 ratio (ref 0.0–5.0)
Cholesterol, Total: 101 mg/dL (ref 100–199)
HDL: 31 mg/dL — ABNORMAL LOW (ref >39)
LDL Chol Calc (NIH): 50 mg/dL (ref 0–99)
Triglycerides: 107 mg/dL (ref 0–149)
VLDL Cholesterol Cal: 20 mg/dL (ref 5–40)

## 2020-08-19 ENCOUNTER — Other Ambulatory Visit: Payer: Self-pay

## 2020-08-19 ENCOUNTER — Ambulatory Visit (HOSPITAL_BASED_OUTPATIENT_CLINIC_OR_DEPARTMENT_OTHER)
Admission: RE | Admit: 2020-08-19 | Discharge: 2020-08-19 | Disposition: A | Discharge status: Home / Self Care | Payer: BLUE CROSS/BLUE SHIELD | Source: Ambulatory Visit | Attending: Cardiology | Admitting: Cardiology

## 2020-08-19 DIAGNOSIS — I77810 Thoracic aortic ectasia: Secondary | ICD-10-CM | POA: Insufficient documentation

## 2020-08-25 ENCOUNTER — Other Ambulatory Visit: Payer: Self-pay | Admitting: Internal Medicine

## 2020-08-25 DIAGNOSIS — R935 Abnormal findings on diagnostic imaging of other abdominal regions, including retroperitoneum: Secondary | ICD-10-CM

## 2020-09-09 ENCOUNTER — Other Ambulatory Visit: Payer: Self-pay | Admitting: Medical

## 2020-09-09 NOTE — Telephone Encounter (Signed)
This is a [place] pt °

## 2020-09-27 ENCOUNTER — Other Ambulatory Visit: Payer: Self-pay | Admitting: Medical

## 2020-10-05 ENCOUNTER — Other Ambulatory Visit: Payer: Self-pay

## 2020-10-05 ENCOUNTER — Ambulatory Visit
Admission: RE | Admit: 2020-10-05 | Discharge: 2020-10-05 | Disposition: A | Discharge status: Home / Self Care | Payer: BLUE CROSS/BLUE SHIELD | Source: Ambulatory Visit | Attending: Internal Medicine | Admitting: Internal Medicine

## 2020-10-05 DIAGNOSIS — R935 Abnormal findings on diagnostic imaging of other abdominal regions, including retroperitoneum: Secondary | ICD-10-CM

## 2020-10-05 MED ORDER — GADOBENATE DIMEGLUMINE 529 MG/ML IV SOLN
20.0000 mL | Freq: Once | INTRAVENOUS | Status: AC | PRN
  Administered 2020-10-05: 20 mL via INTRAVENOUS

## 2020-10-28 ENCOUNTER — Telehealth: Payer: Self-pay

## 2020-10-28 NOTE — Telephone Encounter (Addendum)
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 All reports requested have been printed and given to Dr. Tomie China to complete. 2019 hospital admission with cath report 2022 hospital admission with cath report 4-22 last office note with medication list and printed copy of side effects Date Imdur was started (2019) hospital admission and stopped 03-04-20 office note printed. Stress echo and echo reports were also included.

## 2020-11-04 NOTE — Telephone Encounter (Signed)
Left VM for pt to callback to discuss FMLA papers   Spoke with pt at 1130. Pt states that he needs bulleted points addressed and no additional testing is required. Advised that I will let Dr. Tomie China know what is needed.

## 2020-11-04 NOTE — Telephone Encounter (Signed)
Patient was returning call 

## 2020-11-19 ENCOUNTER — Other Ambulatory Visit: Payer: Self-pay | Admitting: Cardiology

## 2021-01-06 ENCOUNTER — Encounter: Payer: Self-pay | Admitting: Cardiology

## 2021-01-06 NOTE — Telephone Encounter (Signed)
error 

## 2021-01-11 ENCOUNTER — Telehealth: Payer: Self-pay | Admitting: Cardiology

## 2021-01-11 NOTE — Telephone Encounter (Signed)
° °  Pt requesting to speak with Dr. Kem Parkinson nurse regarding his yearly stress test for his clearance. He also wanted to f/u the paperwork with dropped it off at the [place] office last week

## 2021-01-12 ENCOUNTER — Telehealth: Payer: Self-pay | Admitting: Cardiology

## 2021-01-12 DIAGNOSIS — Z955 Presence of coronary angioplasty implant and graft: Secondary | ICD-10-CM

## 2021-01-12 DIAGNOSIS — I2510 Atherosclerotic heart disease of native coronary artery without angina pectoris: Secondary | ICD-10-CM

## 2021-01-12 NOTE — Telephone Encounter (Signed)
Advised that we will get his stress test scheduled at [facility]. Pt verbalized understanding and had no additional questions.

## 2021-01-12 NOTE — Telephone Encounter (Signed)
Pt coming in to check on the status of scheduling his stress test, please advise  Thank you Aaron Summers

## 2021-01-19 DIAGNOSIS — Z955 Presence of coronary angioplasty implant and graft: Secondary | ICD-10-CM | POA: Diagnosis not present

## 2021-01-20 NOTE — Telephone Encounter (Signed)
Pt advised that he had a normal stress test. Pt states that he has already sent the results over night  to the FAA for review. Pt had no additional questions.

## 2021-02-05 ENCOUNTER — Other Ambulatory Visit: Payer: Self-pay | Admitting: Cardiology

## 2021-02-21 ENCOUNTER — Telehealth: Payer: Self-pay

## 2021-02-21 NOTE — Telephone Encounter (Signed)
° °  Pre-operative Risk Assessment    Patient Name: Aaron Summers  DOB: Jul 27, 1950 MRN: [id number]      Request for Surgical Clearance    Procedure:   Colonoscopy  Date of Surgery:  Clearance 03/29/21                                 Surgeon:  Dr Ewing Schlein Surgeon's Group or Practice Name:  [facility] Phone number:  [phone] Fax number:  [phone]   Type of Clearance Requested   - Pharmacy:  Hold Clopidogrel (Plavix)     Type of Anesthesia:   Propofol   Additional requests/questions:    Kathie Dike   02/21/2021, 2:03 PM

## 2021-03-04 DIAGNOSIS — I700 Atherosclerosis of aorta: Secondary | ICD-10-CM

## 2021-03-04 HISTORY — DX: Atherosclerosis of aorta: I70.0

## 2021-03-07 ENCOUNTER — Encounter: Payer: Self-pay | Admitting: Cardiology

## 2021-03-07 ENCOUNTER — Ambulatory Visit: Payer: BLUE CROSS/BLUE SHIELD | Admitting: Cardiology

## 2021-03-07 ENCOUNTER — Other Ambulatory Visit: Payer: Self-pay

## 2021-03-07 VITALS — BP 120/84 | HR 81 | Ht 74.0 in | Wt 241.8 lb

## 2021-03-07 DIAGNOSIS — E669 Obesity, unspecified: Secondary | ICD-10-CM

## 2021-03-07 DIAGNOSIS — I2510 Atherosclerotic heart disease of native coronary artery without angina pectoris: Secondary | ICD-10-CM

## 2021-03-07 DIAGNOSIS — E782 Mixed hyperlipidemia: Secondary | ICD-10-CM

## 2021-03-07 DIAGNOSIS — E66811 Obesity, class 1: Secondary | ICD-10-CM

## 2021-03-07 DIAGNOSIS — I10 Essential (primary) hypertension: Secondary | ICD-10-CM | POA: Diagnosis not present

## 2021-03-07 HISTORY — DX: Obesity, unspecified: E66.9

## 2021-03-07 HISTORY — DX: Obesity, class 1: E66.811

## 2021-03-07 NOTE — Progress Notes (Signed)
?Cardiology Office Note:   ? ?Date:  03/07/2021  ? ?ID:  Aaron Summers, DOB 1950/07/04, MRN [id number] ? ?PCP:  Aaron Funk, MD  ?Cardiologist:  Aaron Brothers, MD  ? ?Referring MD: Aaron Funk, MD  ? ? ?ASSESSMENT:   ? ?1. Essential (primary) hypertension   ?2. Mixed hyperlipidemia   ?3. Mixed dyslipidemia   ?4. Obesity (BMI 30.0-34.9)   ?5. Coronary artery disease involving native coronary artery of native heart without angina pectoris   ? ?PLAN:   ? ?In order of problems listed above: ? ?Coronary artery disease: Secondary prevention stressed with patient.  Importance of compliance with diet and medication stressed any vocalized understanding.  He was advised to continue his regular exercise.  He walks for more than half an hour without any problems on a regular basis. ?Essential hypertension: Blood pressure stable and lifestyle modification urged his blood pressure has been in the range of 110/70 at home and he brought multiple blood pressure readings.  His blood pressure checked by me was 122/72. ?Mixed dyslipidemia: Lipids were reviewed and they are fine and he is on statin therapy ?Diabetes mellitus: I discussed diet and exercise.  His hemoglobin A1c C is 6.6 and this is managed by primary care. ?Weight reduction was stressed and he promises to do better with diet. ?Patient will be seen in follow-up appointment in 6 months or earlier if the patient has any concerns ? ? ? ?Medication Adjustments/Labs and Tests Ordered: ?Current medicines are reviewed at length with the patient today.  Concerns regarding medicines are outlined above.  ?Orders Placed This Encounter  ?Procedures  ? EKG 12-Lead  ? ?No orders of the defined types were placed in this encounter. ? ? ? ?No chief complaint on file. ?  ? ?History of Present Illness:   ? ?Aaron Summers is a 71 y.o. male.  Patient has past medical history of coronary artery disease post LAD stenting, essential hypertension, mixed dyslipidemia and diabetes  mellitus.  He denies any problems at this time and takes care of activities of daily living.  No chest pain orthopnea or PND.  At the time of my evaluation, the patient is alert awake oriented and in no distress.  [facility] and he has excellent effort tolerance. ? ?Past Medical History:  ?Diagnosis Date  ? Angina pectoris (HCC) 12/10/2017  ? 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 CAD   ? a. LHC 12/11/2017: Ost Cx to Prox Cx 100% stenosed with large ramus branch supplying lateral wall, Ost LAD to Prox LAD 40% stenosed, mid LAD 50% stenosed, Ramus 70% stenosed  ? CAD (coronary artery disease) 12/12/2017  ? COVID-19   ? Essential (primary) hypertension 03/05/2018  ? Essential hypertension 03/05/2018  ? Hardening of the aorta (main artery of the heart) (HCC) 03/04/2021  ? History of adenomatous polyp of colon 03/03/2020  ? History of myocardial infarction 03/03/2020  ? HTN (hypertension) 12/10/2017  ? Hyperlipidemia   ? Hypertension   ? Impaired fasting glucose 03/03/2020  ? Ischemic cardiomyopathy 03/03/2020  ? Mixed dyslipidemia 03/05/2018  ? Mixed hyperlipidemia 12/10/2017  ? NSTEMI (non-ST elevated myocardial infarction) (HCC) 12/10/2017  ? Obesity 02/18/2020  ? Type 2 diabetes mellitus with other specified complication (HCC) 03/03/2020  ? Uncontrolled type 2 diabetes mellitus with hyperglycemia (HCC)   ? ? ?Past Surgical History:  ?Procedure Laterality Date  ? CORONARY STENT INTERVENTION N/A 02/19/2020  ? Procedure: CORONARY STENT INTERVENTION;  Surgeon:  Swaziland, Peter M, MD;  Location: [facility] INVASIVE CV LAB;  Service: Cardiovascular;  Laterality: N/A;  ? FOREIGN BODY REMOVAL Left 05/16/2012  ? Procedure: FOREIGN BODY REMOVAL LEFT THUMB;  Surgeon: Aaron Ribas, MD;  Location: [facility];  Service: Orthopedics;  Laterality: Left;  ? FRACTURE SURGERY    ? INGUINAL HERNIA REPAIR Left 1954  ? LEFT HEART CATH AND CORONARY ANGIOGRAPHY N/A 12/11/2017  ? Procedure: LEFT HEART CATH AND CORONARY  ANGIOGRAPHY;  Surgeon: Swaziland, Peter M, MD;  Location: [facility] INVASIVE CV LAB;  Service: Cardiovascular;  Laterality: N/A;  ? LEFT HEART CATH AND CORONARY ANGIOGRAPHY N/A 02/19/2020  ? Procedure: LEFT HEART CATH AND CORONARY ANGIOGRAPHY;  Surgeon: Swaziland, Peter M, MD;  Location: [facility] INVASIVE CV LAB;  Service: Cardiovascular;  Laterality: N/A;  ? ORIF CLAVICULAR FRACTURE Right 1971  ? ? ?Current Medications: ?Current Meds  ?Medication Sig  ? aspirin EC 81 MG tablet Take 81 mg by mouth daily.  ? atorvastatin (LIPITOR) 80 MG tablet Take 80 mg by mouth daily.  ? clopidogrel (PLAVIX) 75 MG tablet TAKE 1 TABLET(75 MG) BY MOUTH DAILY  ? dapagliflozin propanediol (FARXIGA) 10 MG TABS tablet Take 1 tablet (10 mg total) by mouth daily.  ? metFORMIN (GLUCOPHAGE) 500 MG tablet TAKE 1 TABLET BY MOUTH TWICE DAILY  ? metoprolol succinate (TOPROL-XL) 50 MG 24 hr tablet TAKE 1 TABLET BY MOUTH DAILY WITH OR FOLLOWING A MEAL  ? nitroGLYCERIN (NITROSTAT) 0.4 MG SL tablet Place 0.4 mg under the tongue every 5 (five) minutes as needed for chest pain.  ? sacubitril-valsartan (ENTRESTO) 24-26 MG Take 1 tablet by mouth 2 (two) times daily.  ? spironolactone (ALDACTONE) 25 MG tablet Take 0.5 tablets (12.5 mg total) by mouth daily.  ?  ? ?Allergies:   Atorvastatin and Rosuvastatin calcium  ? ?Social History  ? ?Socioeconomic History  ? Marital status: Married  ?  Spouse name: Not on file  ? Number of children: Not on file  ? Years of education: Not on file  ? Highest education level: Not on file  ?Occupational History  ? Not on file  ?Tobacco Use  ? Smoking status: Never  ? Smokeless tobacco: Never  ?Vaping Use  ? Vaping Use: Never used  ?Substance and Sexual Activity  ? Alcohol use: Never  ? Drug use: No  ? Sexual activity: Yes  ?Other Topics Concern  ? Not on file  ?Social History Narrative  ? Not on file  ? ?Social Determinants of Health  ? ?Financial Resource Strain: Not on file  ?Food Insecurity: Not on file  ?Transportation Needs: Not on file   ?Physical Activity: Not on file  ?Stress: Not on file  ?Social Connections: Not on file  ?  ? ?Family History: ?The patient's family history includes Aortic aneurysm in his mother; Cancer in his maternal grandfather; Heart disease in his father. ? ?ROS:   ?Please see the history of present illness.    ?All other systems reviewed and are negative. ? ?EKGs/Labs/Other Studies Reviewed:   ? ?The following studies were reviewed today: ?EKG reveals sinus rhythm mild LVH and nonspecific ST-T changes treadmill stress test with nuclear testing reveals excellent effort tolerance and no evidence of ischemia. ? ? ?Recent Labs: ?08/16/2020: ALT 25; BUN 9; Creatinine, Ser 0.68; Potassium 4.2; Sodium 139  ?Recent Lipid Panel ?   ?Component Value Date/Time  ? CHOL 101 08/16/2020 0817  ? TRIG 107 08/16/2020 0817  ? HDL 31 (L) 08/16/2020 0817  ?  CHOLHDL 3.3 08/16/2020 0817  ? CHOLHDL 4.3 02/18/2020 0400  ? VLDL 37 02/18/2020 0400  ? LDLCALC 50 08/16/2020 0817  ? ? ?Physical Exam:   ? ?VS:  BP 120/84   Pulse 81   Ht 6\' 2"  (1.88 Summers)   Wt 241 lb 12.8 oz (109.7 kg)   SpO2 97%   BMI 31.05 kg/Summers?    ? ?Wt Readings from Last 3 Encounters:  ?03/07/21 241 lb 12.8 oz (109.7 kg)  ?08/13/20 236 lb 9.6 oz (107.3 kg)  ?04/13/20 244 lb (110.7 kg)  ?  ? ?GEN: Patient is in no acute distress ?HEENT: Normal ?NECK: No JVD; No carotid bruits ?LYMPHATICS: No lymphadenopathy ?CARDIAC: Hear sounds regular, 2/6 systolic murmur at the apex. ?RESPIRATORY:  Clear to auscultation without rales, wheezing or rhonchi  ?ABDOMEN: Soft, non-tender, non-distended ?MUSCULOSKELETAL:  No edema; No deformity  ?SKIN: Warm and dry ?NEUROLOGIC:  Alert and oriented x 3 ?PSYCHIATRIC:  Normal affect  ? ?Signed, ?06/13/20, MD  ?03/07/2021 8:28 AM    ?[facility]  ?

## 2021-03-07 NOTE — Patient Instructions (Signed)

## 2021-03-11 NOTE — Telephone Encounter (Signed)
Barb from [facility] calling to follow up on clearance. ?

## 2021-03-17 NOTE — Telephone Encounter (Signed)
? ? ?  Patient Name: Aaron Summers  ?DOB: 1950/02/03 ?MRN: [id number] ? ?Primary Cardiologist: Jenean Lindau, MD ? ?Chart reviewed as part of pre-operative protocol coverage. Patient has a colonoscopy planned for 03/29/2021 and we were asked to give our recommendations for holding Plavix. Patient is now >1 year out from his PCI so Dr. Geraldo Pitter said he could actually stop his Plavix completely. I notified patient of this and he plans to stop Plavix after he has finished his current prescription. He can hold Plavix for 5-7 days prior to colonoscopy.  ? ?I will route this recommendation to the requesting party via Epic fax function and remove from pre-op pool. ? ?Please call with questions. ? ?Darreld Mclean, PA-C ?03/17/2021, 3:15 PM ? ?

## 2021-03-17 NOTE — Telephone Encounter (Signed)
Left message to call back and asks to speak with pre-op team to discuss Dr. Julien Nordmann recommendations as below. ? ?Aaron Mclean, PA-C ?03/17/2021 12:27 PM ? ? ? ?

## 2021-03-17 NOTE — Telephone Encounter (Signed)
Patient called back and I reviewed Dr. Kem Parkinson recommendations below. Patient is aware that he can stop Plavix. He voiced understanding and stated he would not refill prescription after finishing current bottle. Advised patient that he could hold Plavix for 5-7 days prior to procedure. I will notify surgeon's office. ? ?Corrin Parker, PA-C ?03/17/2021 3:15 PM ? ? ?

## 2021-05-29 ENCOUNTER — Other Ambulatory Visit: Payer: Self-pay | Admitting: Cardiology

## 2021-06-09 ENCOUNTER — Other Ambulatory Visit: Payer: Self-pay | Admitting: Cardiology

## 2021-07-27 ENCOUNTER — Other Ambulatory Visit: Payer: Self-pay | Admitting: Cardiology

## 2021-07-28 ENCOUNTER — Telehealth: Payer: Self-pay | Admitting: Cardiology

## 2021-07-28 ENCOUNTER — Other Ambulatory Visit: Payer: Self-pay

## 2021-07-28 MED ORDER — ATORVASTATIN CALCIUM 80 MG PO TABS: 80.0000 mg | ORAL_TABLET | Freq: Every day | ORAL | 0 refills | Status: DC

## 2021-07-28 NOTE — Telephone Encounter (Signed)
*  STAT* If patient is at the pharmacy, call can be transferred to refill team.   1. Which medications need to be refilled? (please list name of each medication and dose if known) atorvastatin (LIPITOR) 80 MG tablet  2. Which pharmacy/location (including street and city if local pharmacy) is medication to be sent to?  [id number] - [place], [place] - [address]  3. Do they need a 30 day or 90 day supply? 90  

## 2021-07-28 NOTE — Telephone Encounter (Signed)
Patient will be out of town and will not have enough to last for his Sept appt. Please see below

## 2021-08-11 ENCOUNTER — Other Ambulatory Visit: Payer: Self-pay | Admitting: Medical

## 2021-09-12 ENCOUNTER — Ambulatory Visit: Payer: BLUE CROSS/BLUE SHIELD | Attending: Cardiology | Admitting: Cardiology

## 2021-09-12 ENCOUNTER — Encounter: Payer: Self-pay | Admitting: Cardiology

## 2021-09-12 VITALS — BP 133/88 | HR 80 | Ht 76.0 in | Wt 238.2 lb

## 2021-09-12 DIAGNOSIS — I2510 Atherosclerotic heart disease of native coronary artery without angina pectoris: Secondary | ICD-10-CM

## 2021-09-12 DIAGNOSIS — E782 Mixed hyperlipidemia: Secondary | ICD-10-CM | POA: Diagnosis not present

## 2021-09-12 DIAGNOSIS — E1169 Type 2 diabetes mellitus with other specified complication: Secondary | ICD-10-CM

## 2021-09-12 DIAGNOSIS — Z1321 Encounter for screening for nutritional disorder: Secondary | ICD-10-CM

## 2021-09-12 DIAGNOSIS — I10 Essential (primary) hypertension: Secondary | ICD-10-CM | POA: Diagnosis not present

## 2021-09-12 NOTE — Patient Instructions (Signed)
Medication Instructions:  Your physician recommends that you continue on your current medications as directed. Please refer to the Current Medication list given to you today.  *If you need a refill on your cardiac medications before your next appointment, please call your pharmacy*   Lab Work: Your physician recommends that you have labs done in the office today. Your test included  basic metabolic panel, complete blood count, TSH, vitamin D, A1C, liver function and lipids.  If you have labs (blood work) drawn today and your tests are completely normal, you will receive your results only by: MyChart Message (if you have MyChart) OR A paper copy in the mail If you have any lab test that is abnormal or we need to change your treatment, we will call you to review the results.   Testing/Procedures: None ordered   Follow-Up: At [facility], you and your health needs are our priority.  As part of our continuing mission to provide you with exceptional heart care, we have created designated Provider Care Teams.  These Care Teams include your primary Cardiologist (physician) and Advanced Practice Providers (APPs -  Physician Assistants and Nurse Practitioners) who all work together to provide you with the care you need, when you need it.  We recommend signing up for the patient portal called "MyChart".  Sign up information is provided on this After Visit Summary.  MyChart is used to connect with patients for Virtual Visits (Telemedicine).  Patients are able to view lab/test results, encounter notes, upcoming appointments, etc.  Non-urgent messages can be sent to your provider as well.   To learn more about what you can do with MyChart, go to [email or web address].    Your next appointment:   9 month(s)  The format for your next appointment:   In Person  Provider:   Rajan Revankar, MD   Other Instructions NA   

## 2021-09-12 NOTE — Progress Notes (Signed)
Cardiology Office Note:    Date:  09/12/2021   ID:  Aaron Summers, DOB 1950-08-07, MRN [id number]  PCP:  Aaron Funk, MD  Cardiologist:  Aaron Brothers, MD   Referring MD: Aaron Funk, MD    ASSESSMENT:    1. Essential (primary) hypertension   2. Mixed hyperlipidemia   3. Coronary artery disease involving native coronary artery of native heart without angina pectoris   4. Type 2 diabetes mellitus with other specified complication, unspecified whether long term insulin use (HCC)   5. Encounter for vitamin deficiency screening    PLAN:    In order of problems listed above:  Coronary artery disease: Secondary prevention stressed with the patient.  Importance of compliance with diet medication stressed any vocalized understanding.  He was was advised to walk at least half an hour a day 5 days a week and he promises to do so. Essential hypertension: Blood pressure stable and diet was emphasized.  Lifestyle modification urged.  His blood pressure stable. Mixed dyslipidemia: Diet was emphasized.  He is fasting and he will undergo lipid check today.  We will also do a hemoglobin A1c. History of vitamin D deficiency: We will check this for him. Dual antiplatelet therapy: Has been more than a year since the intervention and we will stop his aspirin. History of cardiomyopathy: Echocardiogram will be done and this will also help assess murmur heard on auscultation. Patient will be seen in follow-up appointment in 9 months or earlier if the patient has any concerns    Medication Adjustments/Labs and Tests Ordered: Current medicines are reviewed at length with the patient today.  Concerns regarding medicines are outlined above.  Orders Placed This Encounter  Procedures   Basic metabolic panel   CBC   Hemoglobin A1c   Hepatic function panel   Lipid panel   TSH   VITAMIN D 25 Hydroxy (Vit-D Deficiency, Fractures)   No orders of the defined types were placed in this  encounter.    No chief complaint on file.    History of Present Illness:    Aaron Summers is a 71 y.o. male.  Patient has past medical history of coronary artery disease, essential hypertension, dyslipidemia.  He denies any problems at this time and takes care of activities of daily living.  He is active at the cardiac rehab program.  He is happy with this effort tolerance.  At the time of my evaluation, the patient is alert awake oriented and in no distress.  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 LHC 12/11/2017: Ost Cx to Prox Cx 100% stenosed with large ramus branch supplying lateral wall, Ost LAD to Prox LAD 40% stenosed, mid LAD 50% stenosed, Ramus 70% stenosed   COVID-19    Essential (primary) hypertension 03/05/2018   Hardening of the aorta (main artery of the heart) (HCC) 03/04/2021   History of adenomatous polyp of colon 03/03/2020   History of myocardial infarction 03/03/2020   HTN (hypertension) 12/10/2017   Hyperlipidemia    Hypertension    Impaired fasting glucose 03/03/2020   Ischemic cardiomyopathy 03/03/2020   Mixed dyslipidemia 03/05/2018   Mixed hyperlipidemia 12/10/2017   NSTEMI (non-ST elevated myocardial infarction) (HCC) 12/10/2017   Obesity 02/18/2020   Obesity (BMI 30.0-34.9) 03/07/2021   Type 2 diabetes mellitus with other specified complication (HCC) 03/03/2020   Uncontrolled type 2 diabetes mellitus with hyperglycemia (  [facility])     Past Surgical History:  Procedure Laterality Date   CORONARY STENT INTERVENTION N/A 02/19/2020   Procedure: CORONARY STENT INTERVENTION;  Surgeon: Martinique, Peter M, MD;  Location: [facility];  Service: Cardiovascular;  Laterality: N/A;   FOREIGN BODY REMOVAL Left 05/16/2012   Procedure: FOREIGN BODY REMOVAL LEFT THUMB;  Surgeon: Aaron Must, MD;  Location: [place];  Service: Orthopedics;  Laterality: Left;    FRACTURE SURGERY     INGUINAL HERNIA REPAIR Left 1954   LEFT HEART CATH AND CORONARY ANGIOGRAPHY N/A 12/11/2017   Procedure: LEFT HEART CATH AND CORONARY ANGIOGRAPHY;  Surgeon: Martinique, Peter M, MD;  Location: [facility];  Service: Cardiovascular;  Laterality: N/A;   LEFT HEART CATH AND CORONARY ANGIOGRAPHY N/A 02/19/2020   Procedure: LEFT HEART CATH AND CORONARY ANGIOGRAPHY;  Surgeon: Martinique, Peter M, MD;  Location: [facility];  Service: Cardiovascular;  Laterality: N/A;   ORIF CLAVICULAR FRACTURE Right 1971    Current Medications: Current Meds  Medication Sig   aspirin EC 81 MG tablet Take 81 mg by mouth daily.   atorvastatin (LIPITOR) 80 MG tablet Take 1 tablet (80 mg total) by mouth daily.   clopidogrel (PLAVIX) 75 MG tablet TAKE 1 TABLET(75 MG) BY MOUTH DAILY   dapagliflozin propanediol (FARXIGA) 10 MG TABS tablet Take 1 tablet (10 mg total) by mouth daily.   metFORMIN (GLUCOPHAGE) 500 MG tablet TAKE 1 TABLET BY MOUTH TWICE DAILY   metoprolol succinate (TOPROL-XL) 50 MG 24 hr tablet TAKE 1 TABLET BY MOUTH DAILY WITH OR FOLLOWING A MEAL   nitroGLYCERIN (NITROSTAT) 0.4 MG SL tablet Place 0.4 mg under the tongue every 5 (five) minutes as needed for chest pain.   sacubitril-valsartan (ENTRESTO) 24-26 MG Take 1 tablet by mouth 2 (two) times daily.   spironolactone (ALDACTONE) 25 MG tablet TAKE 1/2 TABLET(12.5 MG) BY MOUTH DAILY     Allergies:   Atorvastatin and Rosuvastatin calcium   Social History   Socioeconomic History   Marital status: Married    Spouse name: Not on file   Number of children: Not on file   Years of education: Not on file   Highest education level: Not on file  Occupational History   Not on file  Tobacco Use   Smoking status: Never   Smokeless tobacco: Never  Vaping Use   Vaping Use: Never used  Substance and Sexual Activity   Alcohol use: Never   Drug use: No   Sexual activity: Yes  Other Topics Concern   Not on file  Social History  Narrative   Not on file   Social Determinants of Health   Financial Resource Strain: Not on file  Food Insecurity: Not on file  Transportation Needs: Not on file  Physical Activity: Not on file  Stress: Not on file  Social Connections: Not on file     Family History: The patient's family history includes Aortic aneurysm in his mother; Cancer in his maternal grandfather; Heart disease in his father.  ROS:   Please see the history of present illness.    All other systems reviewed and are negative.  EKGs/Labs/Other Studies Reviewed:    The following studies were reviewed today: Martinique, Peter M, MD (Primary)     Procedures  CORONARY STENT INTERVENTION  LEFT HEART CATH AND CORONARY ANGIOGRAPHY   Conclusion    Ost LAD to Prox LAD lesion is 90% stenosed. A drug-eluting stent was successfully placed using  a STENT RESOLUTE ONYX 4.0X15. Post intervention, there is a 0% residual stenosis. Mid LAD lesion is 50% stenosed. Ost Cx to Prox Cx lesion is 100% stenosed. Ramus lesion is 60% stenosed. LV end diastolic pressure is mildly elevated.   1. 2 vessel occlusive CAD    - acute 90% ostial/proximal LAD with ulceration    - 100% CTO of the LCx 2. Mildly elevated LVEDP 22 mm Hg 3. Successful PCI of the ostial/proximal LAD with DES x 1   Plan: DAPT for at least one year. Guideline directed medical therapy for reduced EF.   Recent Labs: No results found for requested labs within last 365 days.  Recent Lipid Panel    Component Value Date/Time   CHOL 101 08/16/2020 0817   TRIG 107 08/16/2020 0817   HDL 31 (L) 08/16/2020 0817   CHOLHDL 3.3 08/16/2020 0817   CHOLHDL 4.3 02/18/2020 0400   VLDL 37 02/18/2020 0400   LDLCALC 50 08/16/2020 0817    Physical Exam:    VS:  BP 133/88   Pulse 80   Ht 6\' 4"  (1.93 Summers)   Wt 238 lb 3.2 oz (108 kg)   SpO2 95%   BMI 28.99 kg/Summers     Wt Readings from Last 3 Encounters:  09/12/21 238 lb 3.2 oz (108 kg)  03/07/21 241 lb 12.8 oz (109.7  kg)  08/13/20 236 lb 9.6 oz (107.3 kg)     GEN: Patient is in no acute distress HEENT: Normal NECK: No JVD; No carotid bruits LYMPHATICS: No lymphadenopathy CARDIAC: Hear sounds regular, 2/6 systolic murmur at the apex. RESPIRATORY:  Clear to auscultation without rales, wheezing or rhonchi  ABDOMEN: Soft, non-tender, non-distended MUSCULOSKELETAL:  No edema; No deformity  SKIN: Warm and dry NEUROLOGIC:  Alert and oriented x 3 PSYCHIATRIC:  Normal affect   Signed, 10/13/20, MD  09/12/2021 8:35 AM    [facility]

## 2021-09-12 NOTE — Addendum Note (Signed)
Addended by: Eleonore Chiquito on: 09/12/2021 08:44 AM   Modules accepted: Orders

## 2021-09-13 LAB — BASIC METABOLIC PANEL
BUN/Creatinine Ratio: 19 (ref 10–24)
BUN: 13 mg/dL (ref 8–27)
CO2: 22 mmol/L (ref 20–29)
Calcium: 10.1 mg/dL (ref 8.6–10.2)
Chloride: 102 mmol/L (ref 96–106)
Creatinine, Ser: 0.7 mg/dL — ABNORMAL LOW (ref 0.76–1.27)
Glucose: 126 mg/dL — ABNORMAL HIGH (ref 70–99)
Potassium: 4.8 mmol/L (ref 3.5–5.2)
Sodium: 138 mmol/L (ref 134–144)
eGFR: 100 mL/min/{1.73_m2} (ref >59)

## 2021-09-13 LAB — CBC
Hematocrit: 49.3 % (ref 37.5–51.0)
Hemoglobin: 17 g/dL (ref 13.0–17.7)
MCH: 30.8 pg (ref 26.6–33.0)
MCHC: 34.5 g/dL (ref 31.5–35.7)
MCV: 89 fL (ref 79–97)
Platelets: 224 10*3/uL (ref 150–450)
RBC: 5.52 x10E6/uL (ref 4.14–5.80)
RDW: 13.1 % (ref 11.6–15.4)
WBC: 8.2 10*3/uL (ref 3.4–10.8)

## 2021-09-13 LAB — HEMOGLOBIN A1C
Est. average glucose Bld gHb Est-mCnc: 148 mg/dL
Hgb A1c MFr Bld: 6.8 % — ABNORMAL HIGH (ref 4.8–5.6)

## 2021-09-13 LAB — HEPATIC FUNCTION PANEL
ALT: 27 IU/L (ref 0–44)
AST: 19 IU/L (ref 0–40)
Albumin: 4.5 g/dL (ref 3.9–4.9)
Alkaline Phosphatase: 63 IU/L (ref 44–121)
Bilirubin Total: 0.7 mg/dL (ref 0.0–1.2)
Bilirubin, Direct: 0.22 mg/dL (ref 0.00–0.40)
Total Protein: 7.3 g/dL (ref 6.0–8.5)

## 2021-09-13 LAB — LIPID PANEL
Chol/HDL Ratio: 3.5 ratio (ref 0.0–5.0)
Cholesterol, Total: 117 mg/dL (ref 100–199)
HDL: 33 mg/dL — ABNORMAL LOW (ref >39)
LDL Chol Calc (NIH): 61 mg/dL (ref 0–99)
Triglycerides: 127 mg/dL (ref 0–149)
VLDL Cholesterol Cal: 23 mg/dL (ref 5–40)

## 2021-09-13 LAB — TSH: TSH: 3.95 u[IU]/mL (ref 0.450–4.500)

## 2021-09-13 LAB — VITAMIN D 25 HYDROXY (VIT D DEFICIENCY, FRACTURES): Vit D, 25-Hydroxy: 35.8 ng/mL (ref 30.0–100.0)

## 2021-09-16 ENCOUNTER — Other Ambulatory Visit: Payer: Self-pay | Admitting: Medical

## 2021-09-22 ENCOUNTER — Ambulatory Visit: Payer: BLUE CROSS/BLUE SHIELD | Attending: Cardiology

## 2021-09-22 DIAGNOSIS — I2510 Atherosclerotic heart disease of native coronary artery without angina pectoris: Secondary | ICD-10-CM

## 2021-09-23 LAB — ECHOCARDIOGRAM COMPLETE
Area-P 1/2: 2.91 cm2
S' Lateral: 2.4 cm

## 2021-11-05 ENCOUNTER — Other Ambulatory Visit: Payer: Self-pay | Admitting: Cardiology

## 2021-11-06 ENCOUNTER — Other Ambulatory Visit: Payer: Self-pay | Admitting: Cardiology

## 2021-11-28 ENCOUNTER — Telehealth: Payer: Self-pay | Admitting: Cardiology

## 2021-11-28 MED ORDER — METOPROLOL SUCCINATE ER 50 MG PO TB24: 50.0000 mg | ORAL_TABLET | Freq: Every day | ORAL | 2 refills | Status: DC

## 2021-11-28 NOTE — Telephone Encounter (Signed)
°*  STAT* If patient is at the pharmacy, call can be transferred to refill team. ° ° °1. Which medications need to be refilled? (please list name of each medication and dose if known)  metoprolol succinate (TOPROL-XL) 50 MG 24 hr tablet ° °2. Which pharmacy/location (including street and city if local pharmacy) is medication to be sent to? [id number] - [place], [place] - [address] ° °3. Do they need a 30 day or 90 day supply? 90 ° °

## 2021-11-28 NOTE — Telephone Encounter (Signed)
Refill of Metoprolol Succinate 50 mg sent to [facility], [place].

## 2021-11-29 ENCOUNTER — Other Ambulatory Visit: Payer: Self-pay | Admitting: Medical

## 2021-12-22 ENCOUNTER — Telehealth: Payer: Self-pay | Admitting: Cardiology

## 2021-12-22 DIAGNOSIS — I214 Non-ST elevation (NSTEMI) myocardial infarction: Secondary | ICD-10-CM

## 2021-12-22 DIAGNOSIS — I2510 Atherosclerotic heart disease of native coronary artery without angina pectoris: Secondary | ICD-10-CM

## 2021-12-22 NOTE — Telephone Encounter (Signed)
Pt would like an order for myocardial perfusion at [facility]   Pt ask that someone sch him for 2/19, 2/20, 2/21. 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

## 2021-12-28 ENCOUNTER — Encounter: Payer: Self-pay | Admitting: Cardiology

## 2021-12-28 NOTE — Telephone Encounter (Signed)
-----   Message from Francine Graven sent at 12/28/2021  7:59 AM EST ----- Regarding: RE: stress test Good morning,  Which CPT code are looking to precert?  I do not see an order for stress test  Thanks, Anisah  ----- Message ----- From: Eleonore Chiquito, RN Sent: 12/27/2021   4:33 PM EST To: [facility] Pre Cert/Auth Subject: stress test                                    Pt needs a cardiolite stress test at [facility].  Thanks Yoshua Geisinger

## 2021-12-28 NOTE — Telephone Encounter (Signed)
Sent My-Chart message

## 2022-01-06 ENCOUNTER — Other Ambulatory Visit: Payer: Self-pay | Admitting: Cardiology

## 2022-01-18 DIAGNOSIS — R079 Chest pain, unspecified: Secondary | ICD-10-CM

## 2022-02-20 ENCOUNTER — Other Ambulatory Visit: Payer: Self-pay | Admitting: Cardiology

## 2022-02-20 NOTE — Telephone Encounter (Signed)
Rx refill sent to pharmacy. 

## 2022-03-03 ENCOUNTER — Telehealth: Payer: Self-pay | Admitting: Cardiology

## 2022-03-03 NOTE — Telephone Encounter (Signed)
Patient called stating he needs a copy of the summary report to his last echo he had done.  He needs to send it to the Day.  He would like it emailed to him at [email or web address], if it can be emailed.

## 2022-05-06 ENCOUNTER — Other Ambulatory Visit: Payer: Self-pay | Admitting: Cardiology

## 2022-06-06 ENCOUNTER — Other Ambulatory Visit: Payer: Self-pay

## 2022-06-16 ENCOUNTER — Ambulatory Visit: Payer: BC Managed Care – PPO | Attending: Cardiology | Admitting: Cardiology

## 2022-06-16 ENCOUNTER — Encounter: Payer: Self-pay | Admitting: Cardiology

## 2022-06-16 VITALS — BP 133/80 | HR 69 | Ht 74.0 in | Wt 241.4 lb

## 2022-06-16 DIAGNOSIS — E782 Mixed hyperlipidemia: Secondary | ICD-10-CM | POA: Diagnosis not present

## 2022-06-16 DIAGNOSIS — I10 Essential (primary) hypertension: Secondary | ICD-10-CM | POA: Diagnosis not present

## 2022-06-16 DIAGNOSIS — I2510 Atherosclerotic heart disease of native coronary artery without angina pectoris: Secondary | ICD-10-CM | POA: Diagnosis not present

## 2022-06-16 MED ORDER — NITROGLYCERIN 0.4 MG SL SUBL: 0.4000 mg | SUBLINGUAL_TABLET | SUBLINGUAL | 6 refills | Status: DC | PRN

## 2022-06-16 NOTE — Progress Notes (Signed)
Cardiology Office Note:    Date:  06/16/2022   ID:  Aaron Summers, DOB [birth year], MRN [id number]  PCP:  Aaron Funk, MD (Inactive)  Cardiologist:  Aaron Brothers, MD   Referring MD: Aaron Funk, MD    ASSESSMENT:    1. Essential (primary) hypertension   2. Coronary artery disease involving native coronary artery of native heart without angina pectoris   3. Mixed hyperlipidemia    PLAN:    In order of problems listed above:  Coronary artery disease: Secondary prevention stressed with the patient.  Importance of compliance with diet medication stressed any vocalized understanding.  He was advised to get along back with his exercise program.  He is recovering from a upper sinus infection followed by primary care. Essential hypertension: Blood pressure stable and diet was emphasized.  His blood pressure at home are in the range of 120/70 and he showed me multiple readings from his blood pressure machine. Mixed dyslipidemia: On lipid-lowering medications.  Lipids are fine.  Triglycerides are mildly elevated hemoglobin A1c is mildly up diet was emphasized.  Exercise, low sugar and low fatty food diet was encouraged and he vocalized understanding. Obesity: Weight reduction stressed and he promises to do better.  Risks of obesity explained. Patient will be seen in follow-up appointment in 9 months or earlier if the patient has any concerns.    Medication Adjustments/Labs and Tests Ordered: Current medicines are reviewed at length with the patient today.  Concerns regarding medicines are outlined above.  No orders of the defined types were placed in this encounter.  No orders of the defined types were placed in this encounter.    No chief complaint on file.    History of Present Illness:    Aaron Summers is a 71 y.o. male.  Patient has past medical history of coronary artery disease, essential hypertension and mixed dyslipidemia.  He leads an active lifestyle.  He  mentions to me that he leads a sedentary lifestyle for the past 3 weeks because he was busy with his family.  No chest pain orthopnea or PND.  At the time of my evaluation, the patient is alert awake oriented and in no distress.  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 LHC 12/11/2017: Ost Cx to Prox Cx 100% stenosed with large ramus branch supplying lateral wall, Ost LAD to Prox LAD 40% stenosed, mid LAD 50% stenosed, Ramus 70% stenosed   COVID-19    Essential (primary) hypertension 03/05/2018   Hardening of the aorta (main artery of the heart) (HCC) 03/04/2021   History of adenomatous polyp of colon 03/03/2020   History of myocardial infarction 03/03/2020   HTN (hypertension) 12/10/2017   Hyperlipidemia    Hypertension    Impaired fasting glucose 03/03/2020   Ischemic cardiomyopathy 03/03/2020   Mixed dyslipidemia 03/05/2018   Mixed hyperlipidemia 12/10/2017   NSTEMI (non-ST elevated myocardial infarction) (HCC) 12/10/2017   Obesity 02/18/2020   Obesity (BMI 30.0-34.9) 03/07/2021   Type 2 diabetes mellitus with other specified complication (HCC) 03/03/2020   Uncontrolled type 2 diabetes mellitus with hyperglycemia [facility])     Past Surgical History:  Procedure Laterality Date   CORONARY STENT INTERVENTION N/A 02/19/2020   Procedure: CORONARY STENT INTERVENTION;  Surgeon: Summers, Aaron M, MD;  Location: [facility];  Service: Cardiovascular;  Laterality: N/A;   FOREIGN BODY REMOVAL Left 05/16/2012  Procedure: FOREIGN BODY REMOVAL LEFT THUMB;  Surgeon: Aaron Ribas, MD;  Location: [facility];  Service: Orthopedics;  Laterality: Left;   FRACTURE SURGERY     INGUINAL HERNIA REPAIR Left 1954   LEFT HEART CATH AND CORONARY ANGIOGRAPHY N/A 12/11/2017   Procedure: LEFT HEART CATH AND CORONARY ANGIOGRAPHY;  Surgeon: Summers, Aaron M, MD;  Location: [facility] INVASIVE CV LAB;  Service:  Cardiovascular;  Laterality: N/A;   LEFT HEART CATH AND CORONARY ANGIOGRAPHY N/A 02/19/2020   Procedure: LEFT HEART CATH AND CORONARY ANGIOGRAPHY;  Surgeon: Summers, Aaron M, MD;  Location: [facility] INVASIVE CV LAB;  Service: Cardiovascular;  Laterality: N/A;   ORIF CLAVICULAR FRACTURE Right 1971    Current Medications: Current Meds  Medication Sig   atorvastatin (LIPITOR) 80 MG tablet Take 1 tablet (80 mg total) by mouth daily.   clopidogrel (PLAVIX) 75 MG tablet TAKE 1 TABLET(75 MG) BY MOUTH DAILY   dapagliflozin propanediol (FARXIGA) 10 MG TABS tablet Take 1 tablet (10 mg total) by mouth daily.   metFORMIN (GLUCOPHAGE) 500 MG tablet TAKE 1 TABLET BY MOUTH TWICE DAILY   metoprolol succinate (TOPROL-XL) 50 MG 24 hr tablet Take 1 tablet (50 mg total) by mouth daily.   nitroGLYCERIN (NITROSTAT) 0.4 MG SL tablet Place 0.4 mg under the tongue every 5 (five) minutes as needed for chest pain.   sacubitril-valsartan (ENTRESTO) 24-26 MG Take 1 tablet by mouth 2 (two) times daily.   spironolactone (ALDACTONE) 25 MG tablet Take 0.5 tablets (12.5 mg total) by mouth daily.     Allergies:   Atorvastatin and Rosuvastatin calcium   Social History   Socioeconomic History   Marital status: Married    Spouse name: Not on file   Number of children: Not on file   Years of education: Not on file   Highest education level: Not on file  Occupational History   Not on file  Tobacco Use   Smoking status: Never   Smokeless tobacco: Never  Vaping Use   Vaping Use: Never used  Substance and Sexual Activity   Alcohol use: Never   Drug use: No   Sexual activity: Yes  Other Topics Concern   Not on file  Social History Narrative   Not on file   Social Determinants of Health   Financial Resource Strain: Not on file  Food Insecurity: Not on file  Transportation Needs: Not on file  Physical Activity: Not on file  Stress: Not on file  Social Connections: Not on file     Family History: The patient's  family history includes Aortic aneurysm in his mother; Cancer in his maternal grandfather; Heart disease in his father.  ROS:   Please see the history of present illness.    All other systems reviewed and are negative.  EKGs/Labs/Other Studies Reviewed:    The following studies were reviewed today: EKG reveals sinus rhythm and nonspecific ST-T changes   Recent Labs: 09/12/2021: ALT 27; BUN 13; Creatinine, Ser 0.70; Hemoglobin 17.0; Platelets 224; Potassium 4.8; Sodium 138; TSH 3.950  Recent Lipid Panel    Component Value Date/Time   CHOL 117 09/12/2021 0844   TRIG 127 09/12/2021 0844   HDL 33 (L) 09/12/2021 0844   CHOLHDL 3.5 09/12/2021 0844   CHOLHDL 4.3 02/18/2020 0400   VLDL 37 02/18/2020 0400   LDLCALC 61 09/12/2021 0844    Physical Exam:    VS:  BP 133/80 Comment: home  Pulse 69   Ht 6\' 2"  (1.88 Summers)  Wt 241 lb 6.4 oz (109.5 kg)   SpO2 95%   BMI 30.99 kg/Summers     Wt Readings from Last 3 Encounters:  06/16/22 241 lb 6.4 oz (109.5 kg)  09/12/21 238 lb 3.2 oz (108 kg)  03/07/21 241 lb 12.8 oz (109.7 kg)     GEN: Patient is in no acute distress HEENT: Normal NECK: No JVD; No carotid bruits LYMPHATICS: No lymphadenopathy CARDIAC: Hear sounds regular, 2/6 systolic murmur at the apex. RESPIRATORY:  Clear to auscultation without rales, wheezing or rhonchi  ABDOMEN: Soft, non-tender, non-distended MUSCULOSKELETAL:  No edema; No deformity  SKIN: Warm and dry NEUROLOGIC:  Alert and oriented x 3 PSYCHIATRIC:  Normal affect   Signed, Aaron Brothers, MD  06/16/2022 8:11 AM    [facility]

## 2022-06-16 NOTE — Patient Instructions (Signed)
Medication Instructions:  Your physician recommends that you continue on your current medications as directed. Please refer to the Current Medication list given to you today.  *If you need a refill on your cardiac medications before your next appointment, please call your pharmacy*   Lab Work: None ordered If you have labs (blood work) drawn today and your tests are completely normal, you will receive your results only by: MyChart Message (if you have MyChart) OR A paper copy in the mail If you have any lab test that is abnormal or we need to change your treatment, we will call you to review the results.   Testing/Procedures: None ordered   Follow-Up: At  [facility], you and your health needs are our priority.  As part of our continuing mission to provide you with exceptional heart care, we have created designated Provider Care Teams.  These Care Teams include your primary Cardiologist (physician) and Advanced Practice Providers (APPs -  Physician Assistants and Nurse Practitioners) who all work together to provide you with the care you need, when you need it.  We recommend signing up for the patient portal called "MyChart".  Sign up information is provided on this After Visit Summary.  MyChart is used to connect with patients for Virtual Visits (Telemedicine).  Patients are able to view lab/test results, encounter notes, upcoming appointments, etc.  Non-urgent messages can be sent to your provider as well.   To learn more about what you can do with MyChart, go to [email or web address].    Your next appointment:   9 month(s)  The format for your next appointment:   In Person  Provider:   Rajan Revankar, MD    Other Instructions none  Important Information About Sugar       

## 2022-09-01 ENCOUNTER — Other Ambulatory Visit: Payer: Self-pay | Admitting: Cardiology

## 2022-09-19 ENCOUNTER — Other Ambulatory Visit: Payer: Self-pay | Admitting: Cardiology

## 2022-09-27 ENCOUNTER — Telehealth: Payer: Self-pay | Admitting: Cardiology

## 2022-09-27 MED ORDER — METOPROLOL SUCCINATE ER 50 MG PO TB24: 50.0000 mg | ORAL_TABLET | Freq: Every day | ORAL | 2 refills | Status: DC

## 2022-09-27 NOTE — Telephone Encounter (Signed)
*  STAT* If patient is at the pharmacy, call can be transferred to refill team.   1. Which medications need to be refilled? (please list name of each medication and dose if known) metoprolol succinate (TOPROL-XL) 50 MG 24 hr tablet    4. Which pharmacy/location (including street and city if local pharmacy) is medication to be sent to? [id number] - [place], [place] - [address] AT [address]     5. Do they need a 30 day or 90 day supply? 90

## 2022-09-27 NOTE — Telephone Encounter (Signed)
Refill of Metoprolol Succinate 50 mg sent to [facility] on [address].

## 2022-10-31 ENCOUNTER — Other Ambulatory Visit: Payer: Self-pay | Admitting: Cardiology

## 2022-11-06 ENCOUNTER — Encounter: Payer: Self-pay | Admitting: Cardiology

## 2022-11-06 MED ORDER — ATORVASTATIN CALCIUM 80 MG PO TABS: 80.0000 mg | ORAL_TABLET | Freq: Every day | ORAL | 2 refills | Status: DC

## 2023-02-26 LAB — HEMOGLOBIN A1C: A1c: 7.6

## 2023-02-26 LAB — MICROALBUMIN / CREATININE URINE RATIO
Albumin, Urine POC: 0.7
Creatinine, POC: 82 mg/dL
Microalb Creat Ratio: 8.5

## 2023-02-26 LAB — COMPREHENSIVE METABOLIC PANEL WITH GFR: EGFR: 92

## 2023-03-02 ENCOUNTER — Other Ambulatory Visit: Payer: Self-pay | Admitting: Cardiology

## 2023-03-02 NOTE — Telephone Encounter (Signed)
 Prescription sent to pharmacy.

## 2023-03-09 ENCOUNTER — Encounter: Payer: Self-pay | Admitting: Cardiology

## 2023-03-09 ENCOUNTER — Ambulatory Visit: Payer: BC Managed Care – PPO | Attending: Cardiology | Admitting: Cardiology

## 2023-03-09 VITALS — BP 132/74 | HR 74 | Ht 74.0 in | Wt 244.4 lb

## 2023-03-09 DIAGNOSIS — E782 Mixed hyperlipidemia: Secondary | ICD-10-CM | POA: Diagnosis not present

## 2023-03-09 DIAGNOSIS — I10 Essential (primary) hypertension: Secondary | ICD-10-CM

## 2023-03-09 DIAGNOSIS — E1169 Type 2 diabetes mellitus with other specified complication: Secondary | ICD-10-CM | POA: Diagnosis not present

## 2023-03-09 DIAGNOSIS — E66811 Obesity, class 1: Secondary | ICD-10-CM | POA: Diagnosis not present

## 2023-03-09 NOTE — Patient Instructions (Signed)

## 2023-03-09 NOTE — Progress Notes (Signed)
 Cardiology Office Note:    Date:  03/09/2023   ID:  Aaron Summers, DOB August 12, 1950, MRN [id number]  PCP:  Kirby Funk, MD (Inactive)  Cardiologist:  Garwin Brothers, MD   Referring MD: No ref. provider found    ASSESSMENT:    1. Essential (primary) hypertension   2. Type 2 diabetes mellitus with other specified complication, without long-term current use of insulin (HCC)   3. Mixed dyslipidemia   4. Obesity (BMI 30.0-34.9)    PLAN:    In order of problems listed above:  Coronary artery disease: Secondary prevention stressed to the patient.  Importance of compliance with diet medication stressed and vocalized understanding.  He walks for more than half an hour a day on a daily basis and asymptomatic and happy with that. Essential hypertension: Blood pressure is stable and diet was emphasized.  Lifestyle modification urged.  Salt intake and diet issues stressed Mixed dyslipidemia: On lipid-lowering medications followed by primary care.  Lipids reviewed Diabetes mellitus and obesity: Hemoglobin A1c is mildly elevated and I counseled him about this.  Precautions/diet emphasized and he promises to do better.  Risks of obesity emphasized. Patient will be seen in follow-up appointment in 6 months or earlier if the patient has any concerns.    Medication Adjustments/Labs and Tests Ordered: Current medicines are reviewed at length with the patient today.  Concerns regarding medicines are outlined above.  No orders of the defined types were placed in this encounter.  No orders of the defined types were placed in this encounter.    No chief complaint on file.    History of Present Illness:    Aaron Summers is a 73 y.o. male.  Patient has past medical history of coronary artery disease, essential hypertension, mixed dyslipidemia, diabetes mellitus and obesity.  He has history of cardiomyopathy.  Last ejection fraction was normal.  He denies any chest pain orthopnea or PND.  He  takes care of activities of daily living and exercise on a regular basis.  At the time of my evaluation, the patient is alert awake oriented and in no distress.  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 LHC 12/11/2017: Ost Cx to Prox Cx 100% stenosed with large ramus branch supplying lateral wall, Ost LAD to Prox LAD 40% stenosed, mid LAD 50% stenosed, Ramus 70% stenosed   COVID-19    Essential (primary) hypertension 03/05/2018   Hardening of the aorta (main artery of the heart) (HCC) 03/04/2021   History of adenomatous polyp of colon 03/03/2020   History of myocardial infarction 03/03/2020   HTN (hypertension) 12/10/2017   Hyperlipidemia    Hypertension    Impaired fasting glucose 03/03/2020   Ischemic cardiomyopathy 03/03/2020   Mixed dyslipidemia 03/05/2018   Mixed hyperlipidemia 12/10/2017   NSTEMI (non-ST elevated myocardial infarction) (HCC) 12/10/2017   Obesity 02/18/2020   Obesity (BMI 30.0-34.9) 03/07/2021   Type 2 diabetes mellitus with other specified complication (HCC) 03/03/2020   Uncontrolled type 2 diabetes mellitus with hyperglycemia [facility])     Past Surgical History:  Procedure Laterality Date   CORONARY STENT INTERVENTION N/A 02/19/2020   Procedure: CORONARY STENT INTERVENTION;  Surgeon: Swaziland, Peter M, MD;  Location: [facility] INVASIVE CV LAB;  Service: Cardiovascular;  Laterality: N/A;   FOREIGN BODY REMOVAL Left 05/16/2012   Procedure: FOREIGN BODY REMOVAL LEFT THUMB;  Surgeon: Tami Ribas, MD;  Location:  [facility];  Service: Orthopedics;  Laterality: Left;   FRACTURE SURGERY     INGUINAL HERNIA REPAIR Left 1954   LEFT HEART CATH AND CORONARY ANGIOGRAPHY N/A 12/11/2017   Procedure: LEFT HEART CATH AND CORONARY ANGIOGRAPHY;  Surgeon: Swaziland, Peter M, MD;  Location: [facility] INVASIVE CV LAB;  Service: Cardiovascular;  Laterality: N/A;   LEFT HEART CATH AND CORONARY  ANGIOGRAPHY N/A 02/19/2020   Procedure: LEFT HEART CATH AND CORONARY ANGIOGRAPHY;  Surgeon: Swaziland, Peter M, MD;  Location: [facility] INVASIVE CV LAB;  Service: Cardiovascular;  Laterality: N/A;   ORIF CLAVICULAR FRACTURE Right 1971    Current Medications: Current Meds  Medication Sig   atorvastatin (LIPITOR) 80 MG tablet Take 1 tablet (80 mg total) by mouth daily.   clopidogrel (PLAVIX) 75 MG tablet TAKE 1 TABLET(75 MG) BY MOUTH DAILY   dapagliflozin propanediol (FARXIGA) 10 MG TABS tablet TAKE 1 TABLET(10 MG) BY MOUTH DAILY   metFORMIN (GLUCOPHAGE) 500 MG tablet TAKE 1 TABLET BY MOUTH TWICE DAILY   metoprolol succinate (TOPROL-XL) 50 MG 24 hr tablet Take 1 tablet (50 mg total) by mouth daily.   nitroGLYCERIN (NITROSTAT) 0.4 MG SL tablet Place 1 tablet (0.4 mg total) under the tongue every 5 (five) minutes as needed for chest pain.   sacubitril-valsartan (ENTRESTO) 24-26 MG Take 1 tablet by mouth 2 (two) times daily.   spironolactone (ALDACTONE) 25 MG tablet TAKE 1/2 TABLET(12.5 MG) BY MOUTH DAILY     Allergies:   Atorvastatin and Rosuvastatin calcium   Social History   Socioeconomic History   Marital status: Married    Spouse name: Not on file   Number of children: Not on file   Years of education: Not on file   Highest education level: Not on file  Occupational History   Not on file  Tobacco Use   Smoking status: Never   Smokeless tobacco: Never  Vaping Use   Vaping status: Never Used  Substance and Sexual Activity   Alcohol use: Never   Drug use: No   Sexual activity: Yes  Other Topics Concern   Not on file  Social History Narrative   Not on file   Social Drivers of Health   Financial Resource Strain: Not on file  Food Insecurity: Not on file  Transportation Needs: Not on file  Physical Activity: Not on file  Stress: Not on file  Social Connections: Not on file     Family History: The patient's family history includes Aortic aneurysm in his mother; Cancer in his  maternal grandfather; Heart disease in his father.  ROS:   Please see the history of present illness.    All other systems reviewed and are negative.  EKGs/Labs/Other Studies Reviewed:    The following studies were reviewed today: EKG reveals sinus rhythm and nonspecific ST-T changes   Recent Labs: No results found for requested labs within last 365 days.  Recent Lipid Panel    Component Value Date/Time   CHOL 117 09/12/2021 0844   TRIG 127 09/12/2021 0844   HDL 33 (L) 09/12/2021 0844   CHOLHDL 3.5 09/12/2021 0844   CHOLHDL 4.3 02/18/2020 0400   VLDL 37 02/18/2020 0400   LDLCALC 61 09/12/2021 0844    Physical Exam:    VS:  BP 132/74   Pulse 74   Ht 6\' 2"  (1.88 m)   Wt 244 lb 6.4 oz (110.9 kg)   SpO2 96%   BMI 31.38 kg/m     Wt Readings from  Last 3 Encounters:  03/09/23 244 lb 6.4 oz (110.9 kg)  06/16/22 241 lb 6.4 oz (109.5 kg)  09/12/21 238 lb 3.2 oz (108 kg)     GEN: Patient is in no acute distress HEENT: Normal NECK: No JVD; No carotid bruits LYMPHATICS: No lymphadenopathy CARDIAC: Hear sounds regular, 2/6 systolic murmur at the apex. RESPIRATORY:  Clear to auscultation without rales, wheezing or rhonchi  ABDOMEN: Soft, non-tender, non-distended MUSCULOSKELETAL:  No edema; No deformity  SKIN: Warm and dry NEUROLOGIC:  Alert and oriented x 3 PSYCHIATRIC:  Normal affect   Signed, Garwin Brothers, MD  03/09/2023 8:41 AM    [facility]

## 2023-03-15 ENCOUNTER — Other Ambulatory Visit: Payer: Self-pay | Admitting: Cardiology

## 2023-05-27 ENCOUNTER — Other Ambulatory Visit: Payer: Self-pay | Admitting: Cardiology

## 2023-06-13 ENCOUNTER — Other Ambulatory Visit: Payer: Self-pay | Admitting: Cardiology

## 2023-06-15 ENCOUNTER — Other Ambulatory Visit: Payer: Self-pay | Admitting: Cardiology

## 2023-06-17 ENCOUNTER — Other Ambulatory Visit: Payer: Self-pay | Admitting: Cardiology

## 2023-07-11 IMAGING — MR MR ABDOMEN WO/W CM
17 of 18 series · 47 of 48 positions shown · IV contrast (Multihance 20cc)
Comparison: Comparison with exam from August 19, 2020 and with
December 10, 2017.

CLINICAL DATA: Abnormality discovered on prior CT in a 70-year-old
male.

EXAM:
MRI ABDOMEN WITHOUT AND WITH CONTRAST
TECHNIQUE: Multiplanar multisequence MR imaging of the abdomen was performed
both before and after the administration of intravenous contrast.
CONTRAST:  20mL MULTIHANCE GADOBENATE DIMEGLUMINE 529 MG/ML IV SOLN

[Series 4: T2 · coronal · 5.0mm · 1.56mm/px · 2 of 44 slices shown (1 of 3)]
[im 1/44]
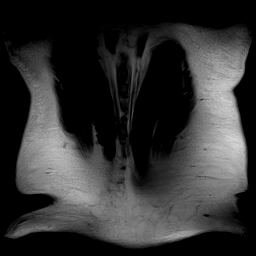
[im 44/44]
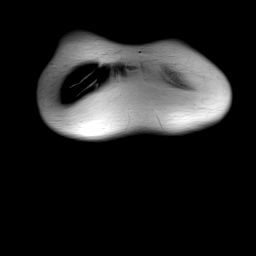

[Series 5: T1 · axial · 3.0mm · 1.25mm/px · z∈[+0,+237]mm · 6 of 160 slices shown]
[im 1/160]
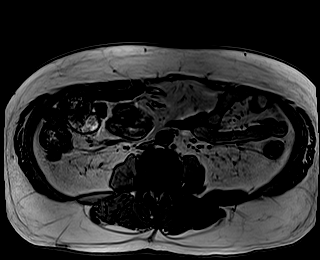
[im 32/160]
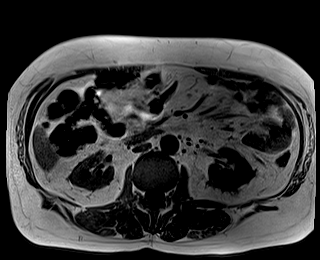
[im 64/160]
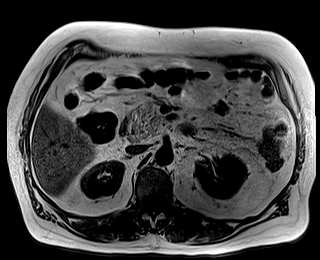
[im 96/160]
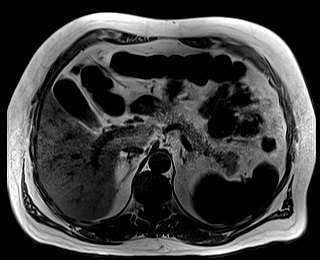
[im 128/160]
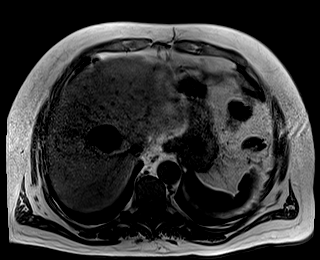
[im 160/160]
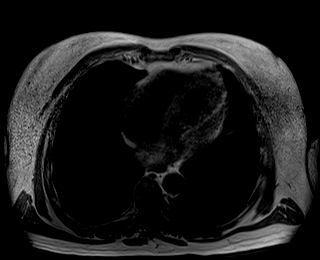

[Series 6: bSSFP · axial · 5.0mm · 1.25mm/px · z∈[-4,+242]mm · 2 of 42 slices shown]
[im 1/42]
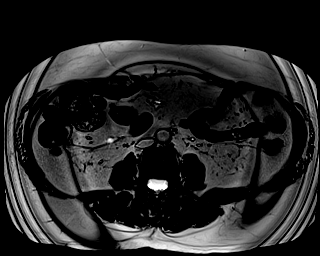
[im 42/42]
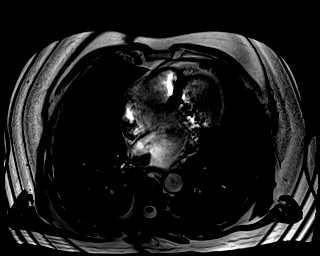

[Series 8: T2 · axial · 5.0mm · 1.56mm/px · 1 of 40 slices shown (2 of 3)]
[im 1/40]
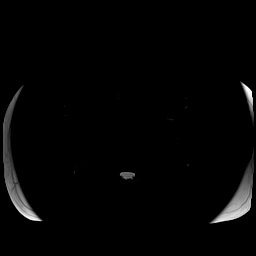

[Series 9: DWI · axial · 5.0mm · 1.57mm/px · z∈[+15,+249]mm · 4 of 120 slices shown (1 of 2)]
[im 1/120]
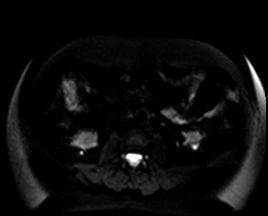
[im 40/120]
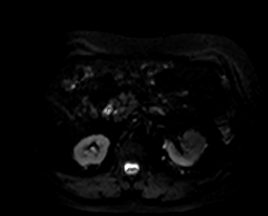
[im 80/120]
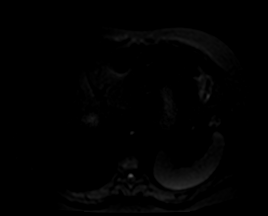
[im 120/120]
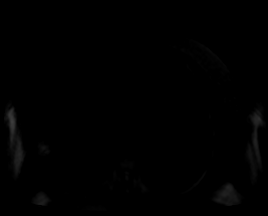

[Series 10: DWI · axial · 5.0mm · 1.57mm/px · 1 of 40 slices shown (2 of 2)]
[im 1/40]
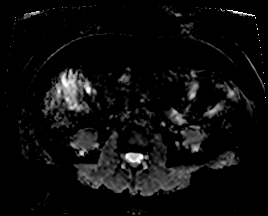

[Series 11: T2 · axial · 6.0mm · 1.25mm/px · 1 of 34 slices shown (3 of 3)]
[im 1/34]
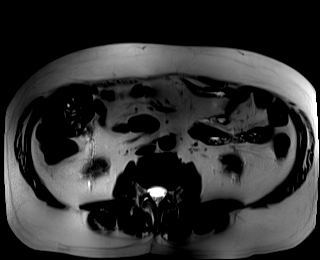

[Series 12: T1 dynamic · axial · non-contrast · 3.0mm · 1.25mm/px · z∈[+3,+240]mm · 3 of 80 slices shown]
[im 1/80]
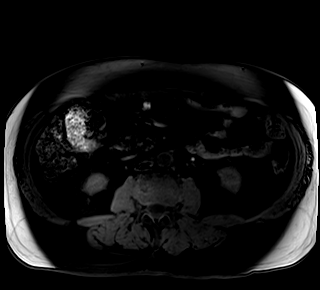
[im 40/80]
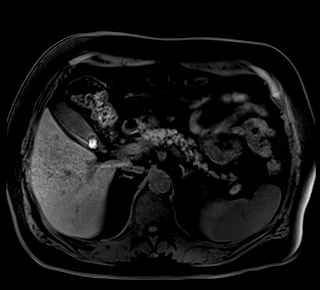
[im 80/80]
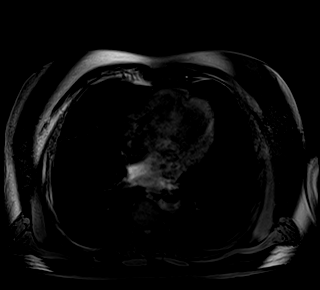

[Series 13: T1 dynamic post-contrast · axial · 3.0mm · 1.25mm/px · z∈[+3,+240]mm · 3 of 80 slices shown (1 of 9)]
[im 1/80]
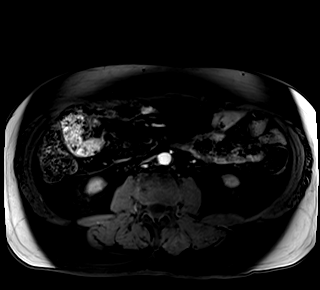
[im 40/80]
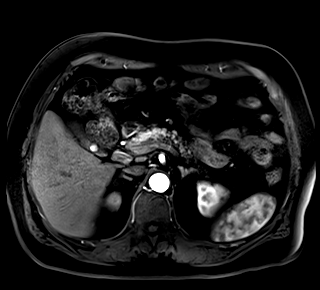
[im 80/80]
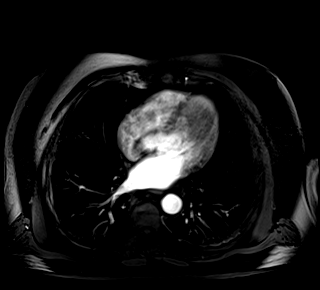

[Series 14: T1 dynamic post-contrast · axial · 3.0mm · 1.25mm/px · z∈[+3,+240]mm · 3 of 80 slices shown (2 of 9)]
[im 1/80]
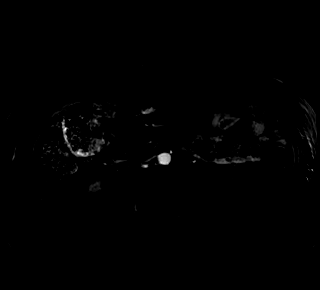
[im 40/80]
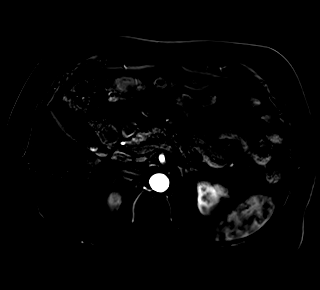
[im 80/80]
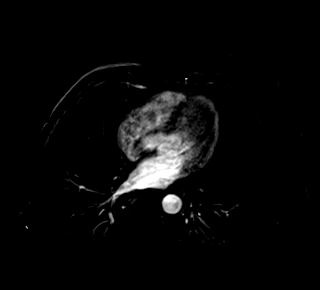

[Series 15: T1 dynamic post-contrast · axial · 3.0mm · 1.25mm/px · z∈[+3,+240]mm · 3 of 80 slices shown (3 of 9)]
[im 1/80]
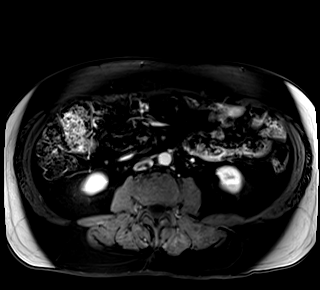
[im 40/80]
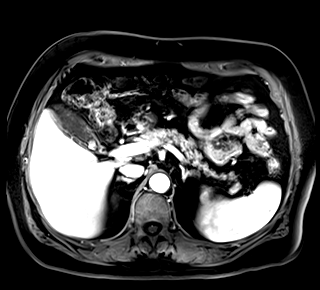
[im 80/80]
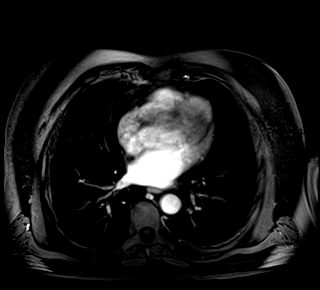

[Series 16: T1 dynamic post-contrast · axial · 3.0mm · 1.25mm/px · z∈[+3,+240]mm · 3 of 80 slices shown (4 of 9)]
[im 1/80]
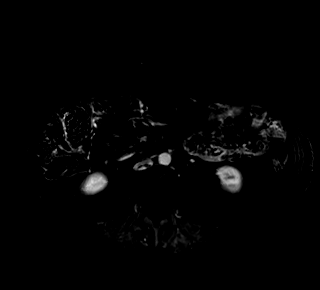
[im 40/80]
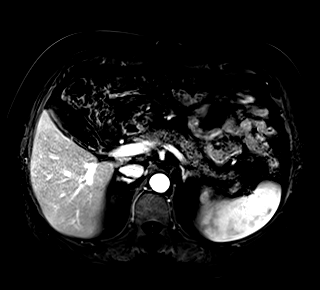
[im 80/80]
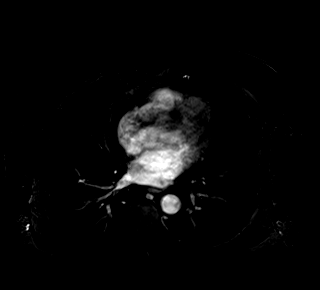

[Series 17: T1 dynamic post-contrast · axial · 3.0mm · 1.25mm/px · z∈[+3,+240]mm · 3 of 80 slices shown (5 of 9)]
[im 1/80]
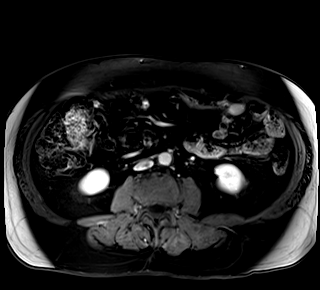
[im 40/80]
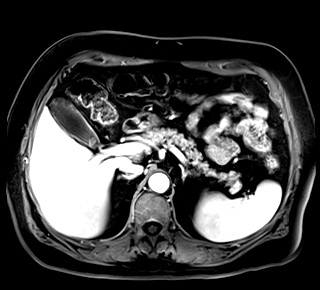
[im 80/80]
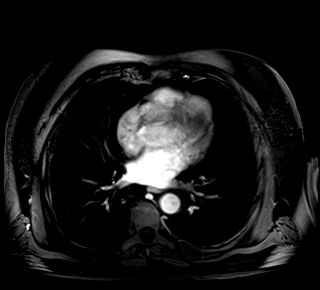

[Series 18: T1 dynamic post-contrast · axial · 3.0mm · 1.25mm/px · z∈[+3,+240]mm · 3 of 80 slices shown (6 of 9)]
[im 1/80]
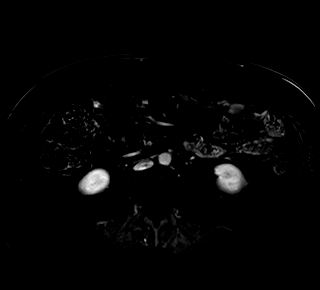
[im 40/80]
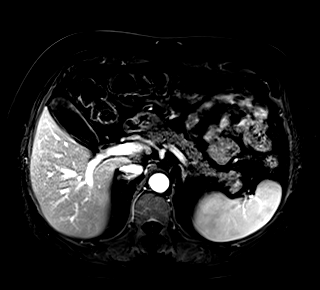
[im 80/80]
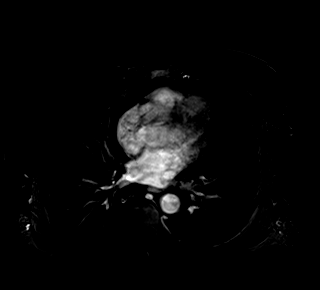

[Series 19: T1 dynamic post-contrast · coronal · 3.0mm · 1.25mm/px · 3 of 88 slices shown (7 of 9)]
[im 1/88]
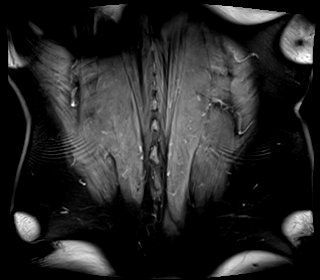
[im 44/88]
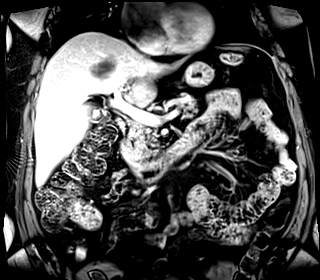
[im 88/88]
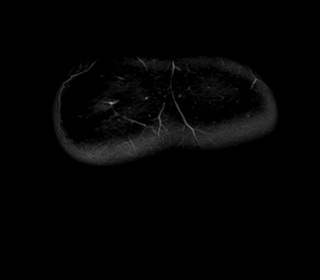

[Series 20: T1 dynamic post-contrast · axial · 3.0mm · 1.25mm/px · z∈[+3,+240]mm · 3 of 80 slices shown (8 of 9)]
[im 1/80]
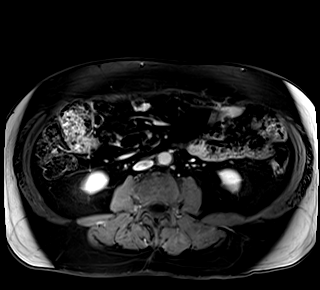
[im 40/80]
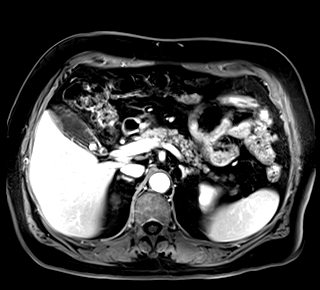
[im 80/80]
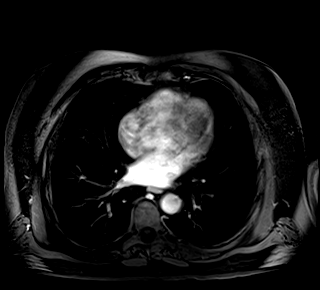

[Series 21: T1 dynamic post-contrast · axial · 3.0mm · 1.25mm/px · z∈[+3,+240]mm · 3 of 80 slices shown (9 of 9)]
[im 1/80]
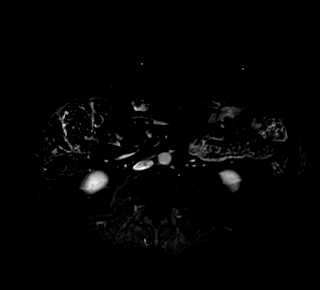
[im 40/80]
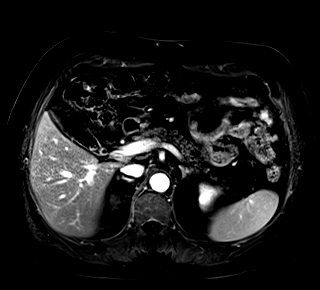
[im 80/80]
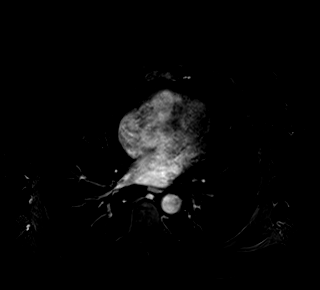

[47 of 48 positions shown; findings below may reference images not displayed]

FINDINGS: Lower chest: Incidental imaging of the lung bases without effusion
or sign of consolidative process.

Hepatobiliary: Hepatic cysts including the subcapsular area along
the RIGHT hepatic lobe that was noted on the recent chest CT.
Dominant hepatic cyst in the central RIGHT hepatic lobe measuring 4
x 3.4 cm. This is only slightly enlarged compared to remote imaging
from 8853.

Replaced RIGHT hepatic artery arises from SMA. LEFT hepatic artery
arises from celiac and classic fashion. Cholelithiasis no
pericholecystic stranding.

Pancreas: Normal intrinsic T1 signal. No ductal dilation or sign of
inflammation. No focal lesion. Mild asymmetric atrophy of the
pancreatic head.

Spleen:  Unremarkable.

Adrenals/Urinary Tract: Normal appearance of the adrenal glands. No
perinephric stranding or hydronephrosis. Small cysts of the
bilateral kidneys, 2-3 on the LEFT and 2 in the upper pole of the
RIGHT kidney. Largest in the upper pole the RIGHT kidney measuring
2.4 cm x 1.7 cm (image [DATE])

Stomach/Bowel: Unremarkable to the extent evaluated on abdominal
MRI.

Vascular/Lymphatic: No pathologically enlarged lymph nodes
identified. No abdominal aortic aneurysm demonstrated.
Atherosclerotic plaque of the abdominal aorta.

Other:  No ascites.

Musculoskeletal: No suspicious bone lesions identified.
IMPRESSION: Hepatic cysts.  No suspicious hepatic lesion.

Renal cysts requiring no further follow-up.

No acute findings in the abdomen.

Aortic Atherosclerosis ([id number]).

## 2023-08-16 LAB — LAB REPORT - SCANNED
Albumin, Urine POC: 0.7
Albumin/Creatinine Ratio, Urine, POC: 8.5
Creatinine, POC: 82 mg/dL
EGFR: 92

## 2023-08-17 ENCOUNTER — Other Ambulatory Visit: Payer: Self-pay | Admitting: Cardiology

## 2023-08-23 ENCOUNTER — Other Ambulatory Visit: Payer: Self-pay | Admitting: Cardiology

## 2023-10-30 ENCOUNTER — Other Ambulatory Visit: Payer: Self-pay

## 2023-10-30 ENCOUNTER — Encounter: Payer: Self-pay | Admitting: Cardiology

## 2023-10-30 MED ORDER — DAPAGLIFLOZIN PROPANEDIOL 10 MG PO TABS: 10.0000 mg | ORAL_TABLET | Freq: Every day | ORAL | 0 refills | Status: DC

## 2023-11-18 ENCOUNTER — Other Ambulatory Visit: Payer: Self-pay | Admitting: Cardiology

## 2023-11-21 ENCOUNTER — Other Ambulatory Visit: Payer: Self-pay | Admitting: Cardiology

## 2023-11-21 DIAGNOSIS — I2510 Atherosclerotic heart disease of native coronary artery without angina pectoris: Secondary | ICD-10-CM

## 2023-12-05 ENCOUNTER — Encounter: Payer: Self-pay | Admitting: Cardiology

## 2023-12-05 ENCOUNTER — Ambulatory Visit: Attending: Cardiology | Admitting: Cardiology

## 2023-12-05 VITALS — BP 130/82 | HR 57 | Ht 74.0 in | Wt 236.2 lb

## 2023-12-05 DIAGNOSIS — I10 Essential (primary) hypertension: Secondary | ICD-10-CM | POA: Diagnosis not present

## 2023-12-05 DIAGNOSIS — E66811 Obesity, class 1: Secondary | ICD-10-CM | POA: Diagnosis not present

## 2023-12-05 DIAGNOSIS — E1169 Type 2 diabetes mellitus with other specified complication: Secondary | ICD-10-CM | POA: Diagnosis not present

## 2023-12-05 DIAGNOSIS — I2510 Atherosclerotic heart disease of native coronary artery without angina pectoris: Secondary | ICD-10-CM

## 2023-12-05 DIAGNOSIS — E782 Mixed hyperlipidemia: Secondary | ICD-10-CM

## 2023-12-05 NOTE — Patient Instructions (Signed)
 Medication Instructions:  Your physician recommends that you continue on your current medications as directed. Please refer to the Current Medication list given to you today.  *If you need a refill on your cardiac medications before your next appointment, please call your pharmacy*   Lab Work: CMP, Lipids, CBC, TSH, Hemoglobin A1C, & Vitamin D  If you have labs (blood work) drawn today and your tests are completely normal, you will receive your results only by: MyChart Message (if you have MyChart) OR A paper copy in the mail If you have any lab test that is abnormal or we need to change your treatment, we will call you to review the results.   Testing/Procedures: None ordered   Follow-Up: At [facility], you and your health needs are our priority.  As part of our continuing mission to provide you with exceptional heart care, we have created designated Provider Care Teams.  These Care Teams include your primary Cardiologist (physician) and Advanced Practice Providers (APPs -  Physician Assistants and Nurse Practitioners) who all work together to provide you with the care you need, when you need it.  We recommend signing up for the patient portal called MyChart.  Sign up information is provided on this After Visit Summary.  MyChart is used to connect with patients for Virtual Visits (Telemedicine).  Patients are able to view lab/test results, encounter notes, upcoming appointments, etc.  Non-urgent messages can be sent to your provider as well.   To learn more about what you can do with MyChart, go to [email or web address].    Your next appointment:   9 month(s)  The format for your next appointment:   In Person  Provider:   Jennifer Crape, MD    Other Instructions none  Important Information About Sugar

## 2023-12-05 NOTE — Progress Notes (Signed)
 Cardiology Office Note:    Date:  12/05/2023   ID:  Aaron Summers, DOB 07-11-1950, MRN [id number]  PCP:  Charlott Dorn LABOR, MD  Cardiologist:  Jennifer JONELLE Crape, MD   Referring MD: Charlott Dorn LABOR, *    ASSESSMENT:    1. Essential (primary) hypertension   2. Mixed dyslipidemia   3. Type 2 diabetes mellitus with other specified complication, without long-term current use of insulin  (HCC)   4. Obesity (BMI 30.0-34.9)   5. Coronary artery disease involving native coronary artery of native heart without angina pectoris    PLAN:    In order of problems listed above:  Coronary artery disease: Secondary prevention stressed with the patient.  Importance of compliance with diet medication stressed and patient verbalized standing.  He has excellent effort tolerance and I congratulated him on his excellent exercise program Essential hypertension: Blood pressure is stable and diet was emphasized.  Lifestyle modification urged. Dyslipidemia: On lipid-lowering medications.  He will have blood work today as he is fasting. Diabetes mellitus: Good control of blood sugar.  Will do hemoglobin A1c today.  Last 1 was 8.1 and he understands the importance of keeping this under good control. Obesity: Weight reduction stressed diet emphasized and he promises to do better. Patient will be seen in follow-up appointment in 6 months or earlier if the patient has any concerns.   Medication Adjustments/Labs and Tests Ordered: Current medicines are reviewed at length with the patient today.  Concerns regarding medicines are outlined above.  Orders Placed This Encounter  Procedures   Comp Met (CMET)   Lipid Profile   CBC   TSH   HgB A1c   Vitamin D  (25 hydroxy)   EKG 12-Lead   No orders of the defined types were placed in this encounter.    No chief complaint on file.    History of Present Illness:    Aaron Summers is a 73 y.o. male with past medical history of coronary artery  disease post intervention, essential hypertension, dyslipidemia and diabetes mellitus.  He exercises on a regular basis and has good effort tolerance.  He denies any chest pain orthopnea or PND.  At the time of my evaluation, the patient is alert awake oriented and in no distress.  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 LHC 12/11/2017: Ost Cx to Prox Cx 100% stenosed with large ramus branch supplying lateral wall, Ost LAD to Prox LAD 40% stenosed, mid LAD 50% stenosed, Ramus 70% stenosed   COVID-19    Essential (primary) hypertension 03/05/2018   Hardening of the aorta (main artery of the heart) 03/04/2021   History of adenomatous polyp of colon 03/03/2020   History of myocardial infarction 03/03/2020   HTN (hypertension) 12/10/2017   Hyperlipidemia    Hypertension    Impaired fasting glucose 03/03/2020   Ischemic cardiomyopathy 03/03/2020   Mixed dyslipidemia 03/05/2018   Mixed hyperlipidemia 12/10/2017   NSTEMI (non-ST elevated myocardial infarction) (HCC) 12/10/2017   Obesity 02/18/2020   Obesity (BMI 30.0-34.9) 03/07/2021   Type 2 diabetes mellitus with other specified complication (HCC) 03/03/2020   Uncontrolled type 2 diabetes mellitus with hyperglycemia [facility])     Past Surgical History:  Procedure Laterality Date   CORONARY STENT INTERVENTION N/A 02/19/2020   Procedure: CORONARY STENT INTERVENTION;  Surgeon: Jordan, Peter M, MD;  Location: [facility];  Service: Cardiovascular;  Laterality: N/A;  FOREIGN BODY REMOVAL Left 05/16/2012   Procedure: FOREIGN BODY REMOVAL LEFT THUMB;  Surgeon: Franky JONELLE Curia, MD;  Location: [facility];  Service: Orthopedics;  Laterality: Left;   FRACTURE SURGERY     INGUINAL HERNIA REPAIR Left 1954   LEFT HEART CATH AND CORONARY ANGIOGRAPHY N/A 12/11/2017   Procedure: LEFT HEART CATH AND CORONARY ANGIOGRAPHY;  Surgeon: Jordan, Peter M, MD;  Location:  [facility] INVASIVE CV LAB;  Service: Cardiovascular;  Laterality: N/A;   LEFT HEART CATH AND CORONARY ANGIOGRAPHY N/A 02/19/2020   Procedure: LEFT HEART CATH AND CORONARY ANGIOGRAPHY;  Surgeon: Jordan, Peter M, MD;  Location: [facility] CV LAB;  Service: Cardiovascular;  Laterality: N/A;   ORIF CLAVICULAR FRACTURE Right 1971    Current Medications: Current Meds  Medication Sig   atorvastatin  (LIPITOR ) 80 MG tablet Take 1 tablet (80 mg total) by mouth daily.   clopidogrel  (PLAVIX ) 75 MG tablet TAKE 1 TABLET(75 MG) BY MOUTH DAILY   dapagliflozin  propanediol (FARXIGA ) 10 MG TABS tablet Take 1 tablet (10 mg total) by mouth daily.   metFORMIN  (GLUCOPHAGE ) 500 MG tablet TAKE 1 TABLET BY MOUTH TWICE DAILY   metoprolol  succinate (TOPROL -XL) 50 MG 24 hr tablet TAKE 1 TABLET(50 MG) BY MOUTH DAILY   nitroGLYCERIN  (NITROSTAT ) 0.4 MG SL tablet DISSOLVE 1 TABLET UNDER TONUGE EVERY 5 MINUTES AS NEEDED FOR CHEST PAIN   sacubitril -valsartan  (ENTRESTO ) 24-26 MG TAKE 1 TABLET BY MOUTH TWICE DAILY   spironolactone  (ALDACTONE ) 25 MG tablet Take 0.5 tablets (12.5 mg total) by mouth daily.     Allergies:   Atorvastatin  and Rosuvastatin calcium    Social History   Socioeconomic History   Marital status: Married    Spouse name: Not on file   Number of children: Not on file   Years of education: Not on file   Highest education level: Not on file  Occupational History   Not on file  Tobacco Use   Smoking status: Never   Smokeless tobacco: Never  Vaping Use   Vaping status: Never Used  Substance and Sexual Activity   Alcohol use: Never   Drug use: No   Sexual activity: Yes  Other Topics Concern   Not on file  Social History Narrative   Not on file   Social Drivers of Health   Financial Resource Strain: Not on file  Food Insecurity: Not on file  Transportation Needs: Not on file  Physical Activity: Not on file  Stress: Not on file  Social Connections: Not on file     Family History: The patient's  family history includes Aortic aneurysm in his mother; Cancer in his maternal grandfather; Heart disease in his father.  ROS:   Please see the history of present illness.    All other systems reviewed and are negative.  EKGs/Labs/Other Studies Reviewed:    The following studies were reviewed today: .SABRAEKG Interpretation Date/Time:  Wednesday December 05 2023 13:22:18 EST Ventricular Rate:  57 PR Interval:  202 QRS Duration:  126 QT Interval:  440 QTC Calculation: 428 R Axis:   -32  Text Interpretation: Sinus bradycardia with occasional Premature ventricular complexes Left axis deviation Non-specific intra-ventricular conduction block Abnormal ECG When compared with ECG of 20-Feb-2020 05:30, Premature ventricular complexes are now Present ST no longer depressed in Anterior leads T wave inversion no longer evident in Anterolateral leads Confirmed by Edwyna Backers [phone]) on 12/05/2023 1:30:17 PM     Recent Labs: No results found for requested labs within last 365 days.  Recent Lipid Panel    Component Value Date/Time   CHOL 117 09/12/2021 0844   TRIG 127 09/12/2021 0844   HDL 33 (L) 09/12/2021 0844   CHOLHDL 3.5 09/12/2021 0844   CHOLHDL 4.3 02/18/2020 0400   VLDL 37 02/18/2020 0400   LDLCALC 61 09/12/2021 0844    Physical Exam:    VS:  BP 130/82   Pulse (!) 57   Ht 6' 2 (1.88 m)   Wt 236 lb 3.2 oz (107.1 kg)   SpO2 97%   BMI 30.33 kg/m     Wt Readings from Last 3 Encounters:  12/05/23 236 lb 3.2 oz (107.1 kg)  03/09/23 244 lb 6.4 oz (110.9 kg)  06/16/22 241 lb 6.4 oz (109.5 kg)     GEN: Patient is in no acute distress HEENT: Normal NECK: No JVD; No carotid bruits LYMPHATICS: No lymphadenopathy CARDIAC: Hear sounds regular, 2/6 systolic murmur at the apex. RESPIRATORY:  Clear to auscultation without rales, wheezing or rhonchi  ABDOMEN: Soft, non-tender, non-distended MUSCULOSKELETAL:  No edema; No deformity  SKIN: Warm and dry NEUROLOGIC:  Alert and  oriented x 3 PSYCHIATRIC:  Normal affect   Signed, Jennifer JONELLE Crape, MD  12/05/2023 1:51 PM    [facility]

## 2023-12-06 ENCOUNTER — Ambulatory Visit: Payer: Self-pay | Admitting: Cardiology

## 2023-12-06 LAB — LIPID PANEL
Chol/HDL Ratio: 3.6 ratio (ref 0.0–5.0)
Cholesterol, Total: 140 mg/dL (ref 100–199)
HDL: 39 mg/dL — ABNORMAL LOW (ref >39)
LDL Chol Calc (NIH): 77 mg/dL (ref 0–99)
Triglycerides: 133 mg/dL (ref 0–149)
VLDL Cholesterol Cal: 24 mg/dL (ref 5–40)

## 2023-12-06 LAB — COMPREHENSIVE METABOLIC PANEL WITH GFR
ALT: 25 IU/L (ref 0–44)
AST: 20 IU/L (ref 0–40)
Albumin: 4.6 g/dL (ref 3.8–4.8)
Alkaline Phosphatase: 64 IU/L (ref 47–123)
BUN/Creatinine Ratio: 21 (ref 10–24)
BUN: 16 mg/dL (ref 8–27)
Bilirubin Total: 1 mg/dL (ref 0.0–1.2)
CO2: 19 mmol/L — ABNORMAL LOW (ref 20–29)
Calcium: 10.2 mg/dL (ref 8.6–10.2)
Chloride: 101 mmol/L (ref 96–106)
Creatinine, Ser: 0.75 mg/dL — ABNORMAL LOW (ref 0.76–1.27)
Globulin, Total: 2.5 g/dL (ref 1.5–4.5)
Glucose: 99 mg/dL (ref 70–99)
Potassium: 4.4 mmol/L (ref 3.5–5.2)
Sodium: 139 mmol/L (ref 134–144)
Total Protein: 7.1 g/dL (ref 6.0–8.5)
eGFR: 96 mL/min/1.73 (ref >59)

## 2023-12-06 LAB — CBC
Hematocrit: 51.4 % — ABNORMAL HIGH (ref 37.5–51.0)
Hemoglobin: 17.3 g/dL (ref 13.0–17.7)
MCH: 30.3 pg (ref 26.6–33.0)
MCHC: 33.7 g/dL (ref 31.5–35.7)
MCV: 90 fL (ref 79–97)
Platelets: 198 x10E3/uL (ref 150–450)
RBC: 5.71 x10E6/uL (ref 4.14–5.80)
RDW: 12.7 % (ref 11.6–15.4)
WBC: 7.1 x10E3/uL (ref 3.4–10.8)

## 2023-12-06 LAB — TSH: TSH: 3.52 u[IU]/mL (ref 0.450–4.500)

## 2023-12-06 LAB — HEMOGLOBIN A1C
Est. average glucose Bld gHb Est-mCnc: 163 mg/dL
Hgb A1c MFr Bld: 7.3 % — ABNORMAL HIGH (ref 4.8–5.6)

## 2023-12-06 LAB — VITAMIN D 25 HYDROXY (VIT D DEFICIENCY, FRACTURES): Vit D, 25-Hydroxy: 28.9 ng/mL — ABNORMAL LOW (ref 30.0–100.0)

## 2023-12-11 ENCOUNTER — Other Ambulatory Visit: Payer: Self-pay | Admitting: Cardiology

## 2024-01-19 ENCOUNTER — Other Ambulatory Visit: Payer: Self-pay | Admitting: Cardiology

## 2024-01-30 ENCOUNTER — Other Ambulatory Visit (HOSPITAL_BASED_OUTPATIENT_CLINIC_OR_DEPARTMENT_OTHER): Payer: Self-pay

## 2024-01-30 MED ORDER — METFORMIN HCL 500 MG PO TABS: 1000.0000 mg | ORAL_TABLET | Freq: Two times a day (BID) | ORAL | 3 refills | Status: AC

## 2024-01-30 MED ORDER — DEXCOM G7 RECEIVER DEVI
3 refills | Status: AC
  Filled 2024-01-31: qty 1, 90d supply, fill #0

## 2024-01-30 MED ORDER — DEXCOM G7 SENSOR MISC
3 refills | Status: AC
  Filled 2024-01-31: qty 2, 30d supply, fill #0

## 2024-01-30 MED FILL — Dapagliflozin Propanediol Tab 10 MG (Base Equivalent): ORAL | 90 days supply | Qty: 90 | Fill #0 | Status: AC

## 2024-01-31 ENCOUNTER — Other Ambulatory Visit (HOSPITAL_BASED_OUTPATIENT_CLINIC_OR_DEPARTMENT_OTHER): Payer: Self-pay

## 2024-01-31 MED ORDER — FREESTYLE LIBRE 14 DAY READER DEVI
0 refills | Status: AC
  Filled 2024-01-31: qty 1, 90d supply, fill #0

## 2024-01-31 MED ORDER — FREESTYLE LIBRE 14 DAY SENSOR MISC
3 refills | Status: AC
  Filled 2024-01-31: qty 2, 28d supply, fill #0

## 2024-02-06 ENCOUNTER — Other Ambulatory Visit (HOSPITAL_BASED_OUTPATIENT_CLINIC_OR_DEPARTMENT_OTHER): Payer: Self-pay

## 2024-02-07 ENCOUNTER — Other Ambulatory Visit (HOSPITAL_BASED_OUTPATIENT_CLINIC_OR_DEPARTMENT_OTHER): Payer: Self-pay

## 2024-02-08 ENCOUNTER — Other Ambulatory Visit (HOSPITAL_BASED_OUTPATIENT_CLINIC_OR_DEPARTMENT_OTHER): Payer: Self-pay
